# Patient Record
Sex: Female | Born: 1975 | Race: Black or African American | Hispanic: No | Marital: Single | Smoking: Never smoker
Health system: Southern US, Community
[De-identification: ages and names within clinical notes are randomized; demographics above are authoritative.]

---

## 2018-04-09 DIAGNOSIS — L814 Other melanin hyperpigmentation: Secondary | ICD-10-CM | POA: Diagnosis not present

## 2018-05-20 DIAGNOSIS — H5203 Hypermetropia, bilateral: Secondary | ICD-10-CM | POA: Diagnosis not present

## 2018-06-10 DIAGNOSIS — H04202 Unspecified epiphora, left lacrimal gland: Secondary | ICD-10-CM | POA: Diagnosis not present

## 2018-07-04 DIAGNOSIS — Z130 Encounter for screening for diseases of the blood and blood-forming organs and certain disorders involving the immune mechanism: Secondary | ICD-10-CM | POA: Diagnosis not present

## 2018-07-04 DIAGNOSIS — Z6828 Body mass index (BMI) 28.0-28.9, adult: Secondary | ICD-10-CM | POA: Diagnosis not present

## 2018-07-04 DIAGNOSIS — R32 Unspecified urinary incontinence: Secondary | ICD-10-CM | POA: Diagnosis not present

## 2018-07-04 DIAGNOSIS — Z01419 Encounter for gynecological examination (general) (routine) without abnormal findings: Secondary | ICD-10-CM | POA: Diagnosis not present

## 2018-07-04 DIAGNOSIS — Z1231 Encounter for screening mammogram for malignant neoplasm of breast: Secondary | ICD-10-CM | POA: Diagnosis not present

## 2018-07-04 DIAGNOSIS — Z1329 Encounter for screening for other suspected endocrine disorder: Secondary | ICD-10-CM | POA: Diagnosis not present

## 2018-07-04 DIAGNOSIS — Z13220 Encounter for screening for lipoid disorders: Secondary | ICD-10-CM | POA: Diagnosis not present

## 2018-07-04 DIAGNOSIS — Z1151 Encounter for screening for human papillomavirus (HPV): Secondary | ICD-10-CM | POA: Diagnosis not present

## 2018-07-04 DIAGNOSIS — Z131 Encounter for screening for diabetes mellitus: Secondary | ICD-10-CM | POA: Diagnosis not present

## 2018-07-04 DIAGNOSIS — Z0000 Encounter for general adult medical examination without abnormal findings: Secondary | ICD-10-CM | POA: Diagnosis not present

## 2018-07-04 DIAGNOSIS — Z124 Encounter for screening for malignant neoplasm of cervix: Secondary | ICD-10-CM | POA: Diagnosis not present

## 2018-07-23 DIAGNOSIS — N6489 Other specified disorders of breast: Secondary | ICD-10-CM | POA: Diagnosis not present

## 2019-04-12 DIAGNOSIS — Z20828 Contact with and (suspected) exposure to other viral communicable diseases: Secondary | ICD-10-CM | POA: Diagnosis not present

## 2019-06-23 DIAGNOSIS — N920 Excessive and frequent menstruation with regular cycle: Secondary | ICD-10-CM | POA: Diagnosis not present

## 2019-06-24 ENCOUNTER — Other Ambulatory Visit: Payer: Self-pay | Admitting: Obstetrics and Gynecology

## 2019-06-24 DIAGNOSIS — D259 Leiomyoma of uterus, unspecified: Secondary | ICD-10-CM

## 2019-07-01 ENCOUNTER — Encounter: Payer: Self-pay | Admitting: *Deleted

## 2019-07-01 ENCOUNTER — Ambulatory Visit
Admission: RE | Admit: 2019-07-01 | Discharge: 2019-07-01 | Disposition: A | Discharge status: Home / Self Care | Payer: BC Managed Care – PPO | Source: Ambulatory Visit | Attending: Obstetrics and Gynecology | Admitting: Obstetrics and Gynecology

## 2019-07-01 ENCOUNTER — Other Ambulatory Visit: Payer: Self-pay

## 2019-07-01 DIAGNOSIS — D259 Leiomyoma of uterus, unspecified: Secondary | ICD-10-CM | POA: Diagnosis not present

## 2019-07-01 HISTORY — PX: IR RADIOLOGIST EVAL & MGMT: IMG5224

## 2019-07-01 NOTE — Consult Note (Signed)
Chief Complaint:  Symptomatic uterine fibroids  Referring Physician(s): Cousins,Sheronette  History of Present Illness: Jasmine Garcia is a 44 y.o. female G5, P2 A3.  No future pregnancy plans.  She has 2 sons as well as 1 grandchild.  No menopausal type symptoms.  Review of her menstrual history performed.  She has a regular monthly cycle lasting up to 5 days with 2 heavy days.  No intraperiod Bleeding.  She describes passage of blood clots.  She also has bulk related symptoms with pelvic pressure, pain, discomfort.  Her uterus is enlarged and can be felt below the umbilicus per the patient.  She currently is not on any birth control pills or hormone replacement therapy.  She had a remote myomectomy 2012.  No recent GYN infections or abnormal discharge.  Unremarkable Pap smear.  Office ultrasound confirms multiple uterine fibroids with the uterus measuring up to 17 weeks size.  No past medical history on file.    Allergies: Patient has no allergy information on record.  Medications: Prior to Admission medications   Not on File     No family history on file.  Social History   Socioeconomic History  . Marital status: Not on file    Spouse name: Not on file  . Number of children: Not on file  . Years of education: Not on file  . Highest education level: Not on file  Occupational History  . Not on file  Tobacco Use  . Smoking status: Not on file  Substance and Sexual Activity  . Alcohol use: Not on file  . Drug use: Not on file  . Sexual activity: Not on file  Other Topics Concern  . Not on file  Social History Narrative  . Not on file   Social Determinants of Health   Financial Resource Strain:   . Difficulty of Paying Living Expenses: Not on file  Food Insecurity:   . Worried About Charity fundraiser in the Last Year: Not on file  . Ran Out of Food in the Last Year: Not on file  Transportation Needs:   . Lack of Transportation (Medical): Not on file  .  Lack of Transportation (Non-Medical): Not on file  Physical Activity:   . Days of Exercise per Week: Not on file  . Minutes of Exercise per Session: Not on file  Stress:   . Feeling of Stress : Not on file  Social Connections:   . Frequency of Communication with Friends and Family: Not on file  . Frequency of Social Gatherings with Friends and Family: Not on file  . Attends Religious Services: Not on file  . Active Member of Clubs or Organizations: Not on file  . Attends Archivist Meetings: Not on file  . Marital Status: Not on file     Review of Systems  Review of Systems: A 12 point ROS discussed and pertinent positives are indicated in the HPI above.  All other systems are negative.  Physical Exam No direct physical exam was performed, telephone health visit only today because of Covid pandemic Vital Signs: There were no vitals taken for this visit.  Imaging: No results found.  Labs:  CBC: No results for input(s): WBC, HGB, HCT, PLT in the last 8760 hours.  COAGS: No results for input(s): INR, APTT in the last 8760 hours.  BMP: No results for input(s): NA, K, CL, CO2, GLUCOSE, BUN, CALCIUM, CREATININE, GFRNONAA, GFRAA in the last 8760 hours.  Invalid input(s): CMP  LIVER FUNCTION TESTS: No results for input(s): BILITOT, AST, ALT, ALKPHOS, PROT, ALBUMIN in the last 8760 hours.   Assessment and Plan:  Symptomatic uterine fibroids with abnormal menstrual bleeding as well as bulk related symptoms with pelvic pain, pressure and discomfort.  Symptoms have recurred despite remote myomectomy.  Ultrasound confirms enlarged fibroid uterus measuring up to 17-week size.  The uterine fibroid embolization procedure was reviewed in detail including the procedure, risk, benefits and expected goals and outcomes.  All risk reviewed.  She understands this requires an overnight recovery for pain management, hydration and to control any nausea or vomiting.  All questions  were addressed.  She has a clear understanding of the procedure after discussion.  She would like to proceed with the work-up including pelvic MRI.  Plan: Scheduled for outpatient pelvic MRI without and with contrast to assess fibroid anatomy for embolization.  Thank you for this interesting consult.  I greatly enjoyed meeting Jasmine Garcia and look forward to participating in their care.  A copy of this report was sent to the requesting provider on this date.  Electronically Signed: Greggory Keen 07/01/2019, 3:27 PM   I spent a total of  40 Minutes   in remote  clinical consultation, greater than 50% of which was counseling/coordinating care for with symptomatic uterine fibroids.    Visit type: Audio only (telephone). Audio (no video) only due to patient's lack of internet/smartphone capability. Alternative for in-person consultation at [facility], [address] [place], [place], [place]. This visit type was conducted due to national recommendations for restrictions regarding the COVID-19 Pandemic (e.g. social distancing).  This format is felt to be most appropriate for this patient at this time.  All issues noted in this document were discussed and addressed.

## 2019-07-02 ENCOUNTER — Other Ambulatory Visit: Payer: Self-pay | Admitting: Obstetrics and Gynecology

## 2019-07-02 DIAGNOSIS — D259 Leiomyoma of uterus, unspecified: Secondary | ICD-10-CM

## 2019-07-24 ENCOUNTER — Ambulatory Visit
Admission: RE | Admit: 2019-07-24 | Discharge: 2019-07-24 | Disposition: A | Discharge status: Home / Self Care | Payer: BC Managed Care – PPO | Source: Ambulatory Visit | Attending: Obstetrics and Gynecology | Admitting: Obstetrics and Gynecology

## 2019-07-24 ENCOUNTER — Other Ambulatory Visit: Payer: Self-pay

## 2019-07-24 DIAGNOSIS — D252 Subserosal leiomyoma of uterus: Secondary | ICD-10-CM | POA: Diagnosis not present

## 2019-07-24 DIAGNOSIS — D259 Leiomyoma of uterus, unspecified: Secondary | ICD-10-CM

## 2019-07-24 MED ORDER — GADOBENATE DIMEGLUMINE 529 MG/ML IV SOLN
14.0000 mL | Freq: Once | INTRAVENOUS | Status: AC | PRN
  Administered 2019-07-24: 14 mL via INTRAVENOUS

## 2019-08-13 ENCOUNTER — Other Ambulatory Visit (HOSPITAL_COMMUNITY): Payer: Self-pay | Admitting: Interventional Radiology

## 2019-08-13 DIAGNOSIS — D259 Leiomyoma of uterus, unspecified: Secondary | ICD-10-CM

## 2019-08-28 ENCOUNTER — Other Ambulatory Visit: Payer: Self-pay | Admitting: Interventional Radiology

## 2019-08-28 DIAGNOSIS — D259 Leiomyoma of uterus, unspecified: Secondary | ICD-10-CM

## 2019-09-10 ENCOUNTER — Other Ambulatory Visit (HOSPITAL_COMMUNITY)
Admission: RE | Admit: 2019-09-10 | Discharge: 2019-09-10 | Disposition: A | Discharge status: Home / Self Care | Payer: BC Managed Care – PPO | Source: Ambulatory Visit | Attending: Interventional Radiology | Admitting: Interventional Radiology

## 2019-09-10 DIAGNOSIS — Z20822 Contact with and (suspected) exposure to covid-19: Secondary | ICD-10-CM | POA: Diagnosis not present

## 2019-09-10 DIAGNOSIS — Z01812 Encounter for preprocedural laboratory examination: Secondary | ICD-10-CM | POA: Insufficient documentation

## 2019-09-11 ENCOUNTER — Other Ambulatory Visit (HOSPITAL_COMMUNITY): Payer: BC Managed Care – PPO

## 2019-09-11 ENCOUNTER — Other Ambulatory Visit: Payer: Self-pay | Admitting: Radiology

## 2019-09-11 LAB — SARS CORONAVIRUS 2 (TAT 6-24 HRS): SARS Coronavirus 2: NEGATIVE

## 2019-09-12 ENCOUNTER — Encounter (HOSPITAL_COMMUNITY): Payer: Self-pay

## 2019-09-12 ENCOUNTER — Observation Stay (HOSPITAL_COMMUNITY)
Admission: RE | Admit: 2019-09-12 | Discharge: 2019-09-13 | Disposition: A | Discharge status: Home / Self Care | Payer: BC Managed Care – PPO | Source: Ambulatory Visit | Attending: Interventional Radiology | Admitting: Interventional Radiology

## 2019-09-12 ENCOUNTER — Ambulatory Visit (HOSPITAL_COMMUNITY)
Admission: RE | Admit: 2019-09-12 | Discharge: 2019-09-12 | Disposition: A | Discharge status: Home / Self Care | Payer: BC Managed Care – PPO | Source: Ambulatory Visit | Attending: Interventional Radiology | Admitting: Interventional Radiology

## 2019-09-12 ENCOUNTER — Other Ambulatory Visit: Payer: Self-pay

## 2019-09-12 DIAGNOSIS — Z79899 Other long term (current) drug therapy: Secondary | ICD-10-CM | POA: Insufficient documentation

## 2019-09-12 DIAGNOSIS — D259 Leiomyoma of uterus, unspecified: Principal | ICD-10-CM

## 2019-09-12 DIAGNOSIS — D219 Benign neoplasm of connective and other soft tissue, unspecified: Secondary | ICD-10-CM | POA: Diagnosis present

## 2019-09-12 HISTORY — PX: IR EMBO TUMOR ORGAN ISCHEMIA INFARCT INC GUIDE ROADMAPPING: IMG5449

## 2019-09-12 HISTORY — PX: IR ANGIOGRAM SELECTIVE EACH ADDITIONAL VESSEL: IMG667

## 2019-09-12 HISTORY — PX: IR US GUIDE VASC ACCESS RIGHT: IMG2390

## 2019-09-12 HISTORY — PX: IR ANGIOGRAM PELVIS SELECTIVE OR SUPRASELECTIVE: IMG661

## 2019-09-12 LAB — BASIC METABOLIC PANEL
Anion gap: 7 (ref 5–15)
BUN: 10 mg/dL (ref 6–20)
CO2: 24 mmol/L (ref 22–32)
Calcium: 9 mg/dL (ref 8.9–10.3)
Chloride: 109 mmol/L (ref 98–111)
Creatinine, Ser: 0.71 mg/dL (ref 0.44–1.00)
GFR calc Af Amer: >60 mL/min (ref >60)
GFR calc non Af Amer: >60 mL/min (ref >60)
Glucose, Bld: 106 mg/dL — ABNORMAL HIGH (ref 70–99)
Potassium: 3.9 mmol/L (ref 3.5–5.1)
Sodium: 140 mmol/L (ref 135–145)

## 2019-09-12 LAB — CBC WITH DIFFERENTIAL/PLATELET
Abs Immature Granulocytes: 0.01 10*3/uL (ref 0.00–0.07)
Basophils Absolute: 0.1 10*3/uL (ref 0.0–0.1)
Basophils Relative: 1 %
Eosinophils Absolute: 0.1 10*3/uL (ref 0.0–0.5)
Eosinophils Relative: 2 %
HCT: 32.3 % — ABNORMAL LOW (ref 36.0–46.0)
Hemoglobin: 9.4 g/dL — ABNORMAL LOW (ref 12.0–15.0)
Immature Granulocytes: 0 %
Lymphocytes Relative: 41 %
Lymphs Abs: 1.9 10*3/uL (ref 0.7–4.0)
MCH: 22.9 pg — ABNORMAL LOW (ref 26.0–34.0)
MCHC: 29.1 g/dL — ABNORMAL LOW (ref 30.0–36.0)
MCV: 78.8 fL — ABNORMAL LOW (ref 80.0–100.0)
Monocytes Absolute: 0.4 10*3/uL (ref 0.1–1.0)
Monocytes Relative: 9 %
Neutro Abs: 2.2 10*3/uL (ref 1.7–7.7)
Neutrophils Relative %: 47 %
Platelets: 346 10*3/uL (ref 150–400)
RBC: 4.1 MIL/uL (ref 3.87–5.11)
RDW: 23.4 % — ABNORMAL HIGH (ref 11.5–15.5)
WBC: 4.6 10*3/uL (ref 4.0–10.5)
nRBC: 0 % (ref 0.0–0.2)

## 2019-09-12 LAB — HCG, SERUM, QUALITATIVE: Preg, Serum: NEGATIVE

## 2019-09-12 LAB — PROTIME-INR
INR: 0.9 (ref 0.8–1.2)
Prothrombin Time: 12.2 seconds (ref 11.4–15.2)

## 2019-09-12 MED ORDER — PROMETHAZINE HCL 25 MG RE SUPP: 25.0000 mg | Freq: Three times a day (TID) | RECTAL | Status: DC | PRN

## 2019-09-12 MED ORDER — MIDAZOLAM HCL 2 MG/2ML IJ SOLN
INTRAMUSCULAR | Status: AC
  Filled 2019-09-12: qty 6

## 2019-09-12 MED ORDER — SODIUM CHLORIDE 0.9% FLUSH: 3.0000 mL | INTRAVENOUS | Status: DC | PRN

## 2019-09-12 MED ORDER — ONDANSETRON HCL 4 MG/2ML IJ SOLN: 4.0000 mg | Freq: Four times a day (QID) | INTRAMUSCULAR | Status: DC | PRN

## 2019-09-12 MED ORDER — PROMETHAZINE HCL 25 MG PO TABS: 25.0000 mg | ORAL_TABLET | Freq: Three times a day (TID) | ORAL | Status: DC | PRN

## 2019-09-12 MED ORDER — LIDOCAINE HCL 1 % IJ SOLN
INTRAMUSCULAR | Status: AC
  Filled 2019-09-12: qty 20

## 2019-09-12 MED ORDER — IOHEXOL 300 MG/ML  SOLN: 100.0000 mL | Freq: Once | INTRAMUSCULAR | Status: DC | PRN

## 2019-09-12 MED ORDER — KETOROLAC TROMETHAMINE 30 MG/ML IJ SOLN
INTRAMUSCULAR | Status: AC
  Administered 2019-09-12: 30 mg via INTRAVENOUS
  Filled 2019-09-12: qty 1

## 2019-09-12 MED ORDER — FENTANYL CITRATE (PF) 100 MCG/2ML IJ SOLN
INTRAMUSCULAR | Status: AC | PRN
  Administered 2019-09-12 (×2): 50 ug via INTRAVENOUS

## 2019-09-12 MED ORDER — DOCUSATE SODIUM 100 MG PO CAPS
100.0000 mg | ORAL_CAPSULE | Freq: Two times a day (BID) | ORAL | Status: DC
  Administered 2019-09-12 – 2019-09-13 (×2): 100 mg via ORAL
  Filled 2019-09-12 (×3): qty 1

## 2019-09-12 MED ORDER — CEFAZOLIN SODIUM-DEXTROSE 2-4 GM/100ML-% IV SOLN: 2.0000 g | INTRAVENOUS | Status: AC

## 2019-09-12 MED ORDER — DIPHENHYDRAMINE HCL 50 MG/ML IJ SOLN: 12.5000 mg | Freq: Four times a day (QID) | INTRAMUSCULAR | Status: DC | PRN

## 2019-09-12 MED ORDER — HYDROMORPHONE 1 MG/ML IV SOLN
INTRAVENOUS | Status: DC
  Administered 2019-09-12: 30 mg via INTRAVENOUS
  Filled 2019-09-12: qty 30

## 2019-09-12 MED ORDER — DOCUSATE SODIUM 100 MG PO CAPS: 100.0000 mg | ORAL_CAPSULE | Freq: Two times a day (BID) | ORAL | Status: DC

## 2019-09-12 MED ORDER — FERROUS SULFATE 325 (65 FE) MG PO TABS
325.0000 mg | ORAL_TABLET | Freq: Three times a day (TID) | ORAL | Status: DC
  Administered 2019-09-13: 325 mg via ORAL
  Filled 2019-09-12: qty 1

## 2019-09-12 MED ORDER — SODIUM CHLORIDE 0.9% FLUSH: 3.0000 mL | Freq: Two times a day (BID) | INTRAVENOUS | Status: DC

## 2019-09-12 MED ORDER — SODIUM CHLORIDE 0.9 % IV SOLN: INTRAVENOUS | Status: DC

## 2019-09-12 MED ORDER — MIDAZOLAM HCL 2 MG/2ML IJ SOLN
INTRAMUSCULAR | Status: AC | PRN
  Administered 2019-09-12 (×4): 1 mg via INTRAVENOUS

## 2019-09-12 MED ORDER — DIPHENHYDRAMINE HCL 12.5 MG/5ML PO ELIX
12.5000 mg | ORAL_SOLUTION | Freq: Four times a day (QID) | ORAL | Status: DC | PRN
  Filled 2019-09-12: qty 5

## 2019-09-12 MED ORDER — SODIUM CHLORIDE 0.9 % IV SOLN: 250.0000 mL | INTRAVENOUS | Status: DC | PRN

## 2019-09-12 MED ORDER — NALOXONE HCL 0.4 MG/ML IJ SOLN: 0.4000 mg | INTRAMUSCULAR | Status: DC | PRN

## 2019-09-12 MED ORDER — KETOROLAC TROMETHAMINE 30 MG/ML IJ SOLN: 30.0000 mg | INTRAMUSCULAR | Status: AC

## 2019-09-12 MED ORDER — LIDOCAINE HCL (PF) 1 % IJ SOLN
INTRAMUSCULAR | Status: AC | PRN
  Administered 2019-09-12 (×2): 5 mL

## 2019-09-12 MED ORDER — FENTANYL CITRATE (PF) 100 MCG/2ML IJ SOLN
INTRAMUSCULAR | Status: AC
  Filled 2019-09-12: qty 4

## 2019-09-12 MED ORDER — SODIUM CHLORIDE 0.9% FLUSH: 9.0000 mL | INTRAVENOUS | Status: DC | PRN

## 2019-09-12 MED ORDER — CEFAZOLIN SODIUM-DEXTROSE 2-4 GM/100ML-% IV SOLN
INTRAVENOUS | Status: AC
  Administered 2019-09-12: 2 g via INTRAVENOUS
  Filled 2019-09-12: qty 100

## 2019-09-12 MED ORDER — DEXAMETHASONE 4 MG PO TABS
8.0000 mg | ORAL_TABLET | Freq: Once | ORAL | Status: AC
  Administered 2019-09-12: 8 mg via ORAL
  Filled 2019-09-12: qty 2

## 2019-09-12 MED ORDER — ONDANSETRON HCL 4 MG/2ML IJ SOLN
4.0000 mg | Freq: Four times a day (QID) | INTRAMUSCULAR | Status: DC | PRN
  Administered 2019-09-12: 4 mg via INTRAVENOUS
  Filled 2019-09-12 (×2): qty 2

## 2019-09-12 MED ORDER — KETOROLAC TROMETHAMINE 30 MG/ML IJ SOLN
30.0000 mg | Freq: Four times a day (QID) | INTRAMUSCULAR | Status: DC
  Administered 2019-09-12 – 2019-09-13 (×4): 30 mg via INTRAVENOUS
  Filled 2019-09-12 (×4): qty 1

## 2019-09-12 NOTE — Progress Notes (Signed)
Patient ID: Jasmine Garcia, female   DOB: [birth year], 44 y.o.   MRN: [id number] Pt c/o some expected pelvic cramping, occ nausea(possibly from dilaudid) VSS; afebrile Puncture sites R/L CFA soft, clean, dry, NT, no hematomas; intact distal pulses; yellow urine in foley cath   A/P: Pt with hx symptomatic uterine fibroids; s/p bilat uterine artery embo earlier today; for overnight obs for pain control; dilaudid PCA for pain, zofran prn nausea; advance diet as tolerated; d/c foley cath later today; for f/u with Dr. Annamaria Boots at [place] [facility] (either virtual or in person) in 3-4 weeks; f/u pelvic MRI in 6 months

## 2019-09-12 NOTE — Procedures (Deleted)
Interventional Radiology Procedure Note  Procedure: RT IJ POWER PORT  Complications: None  Estimated Blood Loss: MIN  Findings: TIP SVCRA       

## 2019-09-12 NOTE — Progress Notes (Signed)
Patient ID: Jasmine Garcia, female   DOB: 10-25-1975, 44 y.o.   MRN: [id number] Interventional Radiology Procedure Note  Procedure: UFE  Complications: None  Estimated Blood Loss: min  Findings: Successful bilateral embo

## 2019-09-12 NOTE — H&P (Signed)
Referring Physician(s): Cousins,S  Supervising Physician: Daryll Brod  Patient Status:  [facility] OP TBA  Chief Complaint: Symptomatic uterine fibroids   Subjective: Patient familiar to IR service from tele consultation with Dr. Annamaria Boots on 07/01/2019 to discuss treatment options for symptomatic uterine fibroids. She was deemed an appropriate candidate for bilateral uterine artery embolization and presents today for the procedure. She currently denies fever, headache, chest pain, dyspnea, cough, abdominal/back pain, nausea, vomiting, hematuria/dysuria.  She does have a history of menorrhagia and prior myomectomy in 2012.  History reviewed. No pertinent past medical history. Past Surgical History:  Procedure Laterality Date  . IR RADIOLOGIST EVAL & MGMT  07/01/2019     Allergies: Patient has no allergy information on record.  Medications: Prior to Admission medications   Not on File     Vital Signs: Vitals:   09/12/19 0834  BP: (!) 142/97  Pulse: 86  Resp: 18  Temp: 99 F (37.2 C)  SpO2: 100%      Physical Exam:  Awake/alert; chest- CTA bilat; heart- RRR; abd- soft,+BS,NT; ext- no edema  Imaging: No results found.  Labs:  CBC: No results for input(s): WBC, HGB, HCT, PLT in the last 8760 hours.  COAGS: No results for input(s): INR, APTT in the last 8760 hours.  BMP: No results for input(s): NA, K, CL, CO2, GLUCOSE, BUN, CALCIUM, CREATININE, GFRNONAA, GFRAA in the last 8760 hours.  Invalid input(s): CMP  LIVER FUNCTION TESTS: No results for input(s): BILITOT, AST, ALT, ALKPHOS, PROT, ALBUMIN in the last 8760 hours.  Assessment and Plan: Pt with hx symptomatic uterine fibroids, s/p tele consult with Dr. Annamaria Boots on 07/01/19 to discuss treatment options and deemed an appropriate candidate for bilateral uterine artery embolization. She presents today for the procedure. Risks and benefits of procedure were discussed with the patient including, but not limited to  bleeding, infection, vascular injury or contrast induced renal failure.  This interventional procedure involves the use of X-rays and because of the nature of the planned procedure, it is possible that we will have prolonged use of X-ray fluoroscopy.  Potential radiation risks to you include (but are not limited to) the following: - A slightly elevated risk for cancer  several years later in life. This risk is typically less than 0.5% percent. This risk is low in comparison to the normal incidence of human cancer, which is 33% for women and 50% for men according to the [place]. - Radiation induced injury can include skin redness, resembling a rash, tissue breakdown / ulcers and hair loss (which can be temporary or permanent).   The likelihood of either of these occurring depends on the difficulty of the procedure and whether you are sensitive to radiation due to previous procedures, disease, or genetic conditions.   IF your procedure requires a prolonged use of radiation, you will be notified and given written instructions for further action.  It is your responsibility to monitor the irradiated area for the 2 weeks following the procedure and to notify your physician if you are concerned that you have suffered a radiation induced injury.    All of the patient's questions were answered, patient is agreeable to proceed.  Consent signed and in chart.  Postprocedure she will be admitted for overnight obs for pain control    Electronically Signed: D. Rowe Robert, PA-C 09/12/2019, 8:31 AM   I spent a total of 30 minutes at the the patient's bedside AND on the patient's hospital floor or unit,  greater than 50% of which was counseling/coordinating care for bilateral uterine artery embolization

## 2019-09-13 DIAGNOSIS — D259 Leiomyoma of uterus, unspecified: Secondary | ICD-10-CM | POA: Diagnosis not present

## 2019-09-13 DIAGNOSIS — Z79899 Other long term (current) drug therapy: Secondary | ICD-10-CM | POA: Diagnosis not present

## 2019-09-13 MED ORDER — IBUPROFEN 600 MG PO TABS: 600.0000 mg | ORAL_TABLET | Freq: Four times a day (QID) | ORAL | 0 refills | Status: AC

## 2019-09-13 MED ORDER — TRAMADOL HCL 50 MG PO TABS: 50.0000 mg | ORAL_TABLET | Freq: Four times a day (QID) | ORAL | 0 refills | Status: AC | PRN

## 2019-09-13 MED ORDER — DOCUSATE SODIUM 100 MG PO CAPS: 100.0000 mg | ORAL_CAPSULE | Freq: Every day | ORAL | 0 refills | Status: AC | PRN

## 2019-09-13 MED ORDER — TRAMADOL HCL 50 MG PO TABS
50.0000 mg | ORAL_TABLET | Freq: Four times a day (QID) | ORAL | Status: DC | PRN
  Administered 2019-09-13: 50 mg via ORAL
  Filled 2019-09-13: qty 1

## 2019-09-13 MED ORDER — ONDANSETRON HCL 8 MG PO TABS: 8.0000 mg | ORAL_TABLET | Freq: Three times a day (TID) | ORAL | 0 refills | Status: AC | PRN

## 2019-09-13 NOTE — Progress Notes (Signed)
Patient discharged to home with family, discharge instructions reviewed with patient who verbalized understanding. New RX's sent electronically to pharmacy. 

## 2019-09-13 NOTE — Discharge Summary (Signed)
Patient ID: Jasmine Garcia MRN: [id number] DOB/AGE: 1975/10/15 44 y.o.  Admit date: 09/12/2019 Discharge date: 09/13/2019  Supervising Physician: Markus Daft  Patient Status: [name] - In-pt  Admission Diagnoses: Symptomatic uterine leiomyoma  Discharge Diagnoses:  Active Problems:   Uterine leiomyoma   Fibroids   Discharged Condition: good  Hospital Course: Ms. Zaccaria presented to IR on 09/12/19 for planned uterine artery embolization which occurred as planned without immediate complications. She was admitted for overnight observation and pain control. She reports some nausea and abdominal pain last night but feels great today and is ready to go home ASAP. She has been able to eat, drink, urinate and ambulate without issue today. Per RN and patient did not use PCA as patient had previously used dilaudid and did not like the way it felt, she did use PRN Ultram x 1. She is requesting Ultram at d/c for PRN pain control instead of narcotic medications. Discussed home wound care instructions and return precautions with patient today. She will see [place] in clinic in 3-4 weeks (IR scheduler will call with appointment date/time) and a pelvic MRI in 6 months. She was encouraged to call our office with any questions or concerns before her appointment. Patient understands and is in agreement with above plan.  Consults: None  Significant Diagnostic Studies: IR Angiogram Pelvis Selective Or Supraselective  Result Date: 09/12/2019 INDICATION: Symptomatic uterine fibroids, abnormal menstrual bleeding, her deficiency anemia EXAM: UTERINE FIBROID EMBOLIZATION Date:  09/12/2019 09/12/2019 11:28 am Radiologist:  M. Daryll Brod, MD Guidance:  Ultrasound and fluoroscopic MEDICATIONS: Ancef 2 g. The antibiotic was administered within 1 hour of the procedure ANESTHESIA/SEDATION: Fentanyl 100 mcg IV; Versed 4.0 mg IV, 30 mg Toradol Moderate Sedation Time:  54 minutes The patient was continuously monitored during the  procedure by the interventional radiology nurse under my direct supervision. CONTRAST:  120 cc omni 300 FLUOROSCOPY TIME:  Fluoroscopy Time: 10 minutes 30 seconds (1,016 mGy). COMPLICATIONS: None immediate. PROCEDURE: Informed consent was obtained from the patient following explanation of the procedure, risks, benefits and alternatives. The patient understands, agrees and consents for the procedure. All questions were addressed. A time out was performed. Maximal barrier sterile technique utilized including caps, mask, sterile gowns, sterile gloves, large sterile drape, hand hygiene, and betadine prep. Under sterile conditions and local anesthesia, bilateralcommon femoral artery access was performed with a micropuncture needles. Ultrasound was utilized for access. Images were obtained for documentation of the patent bilateral common femoral arteries. Bentson guidewires were advanced followed by 5-French sheaths A 5-French C2 catheter was utilized to select the contralateral left internal iliac artery. Selective left internal iliac angiogram was performed. The tortuous left uterine artery was identified. Selective catheterization was performed of the left uterine artery with a microcatheter and micro guide wire. A selective left uterine angiogram was performed. This demonstrated patency of the left uterine artery. Mild diffuse hypervascularity of the enlarged fibroid uterus. Access was adequate for embolization. For embolization, 1.5 vials of 500 - 700 micron Embospheres and 1.5 vialsof 700-900 micron Embospheres were injected into the left uterine artery. Post embolization angiogram confirms complete stasis of the left uterine vascular territory. Microcatheter was removed. A C2 catheter was also utilized to select the right internal iliac artery. Selective right internal iliac angiogram was performed. The patent right uterine artery was identified. For selective catheterization, the micro catheter and guidewire were  utilized to select the right uterine artery. Selective right uterine angiogram was performed. This demonstrated patency of the right uterine artery.  Catheter position was safe for embolization. Embolization was performed to complete stasis with injection of 1.5 vials of 500-700 micron Embospheres and 1.5 vialsof 700-900 micron Embospheres. Post embolization angiogram confirms complete stasis of the right uterine vascular territory. C2 catheters and micro catheters were removed. Injection of both common femoral artery sheath confirms access is adequate for ExoSeal. Hemostasis was obtained with and ExoSeal device on the left but unsuccessful on the right. No immediate complication. Peripheral pedal pulses are normal +2. The patient tolerated the procedure well. No immediate complication. IMPRESSION: Successful bilateral uterine artery embolization (U F E) Electronically Signed   By: Jerilynn Mages.  Shick M.D.   On: 09/12/2019 11:57   IR Angiogram Selective Each Additional Vessel  Result Date: 09/12/2019 INDICATION: Symptomatic uterine fibroids, abnormal menstrual bleeding, her deficiency anemia EXAM: UTERINE FIBROID EMBOLIZATION Date:  09/12/2019 09/12/2019 11:28 am Radiologist:  M. Daryll Brod, MD Guidance:  Ultrasound and fluoroscopic MEDICATIONS: Ancef 2 g. The antibiotic was administered within 1 hour of the procedure ANESTHESIA/SEDATION: Fentanyl 100 mcg IV; Versed 4.0 mg IV, 30 mg Toradol Moderate Sedation Time:  54 minutes The patient was continuously monitored during the procedure by the interventional radiology nurse under my direct supervision. CONTRAST:  120 cc omni 300 FLUOROSCOPY TIME:  Fluoroscopy Time: 10 minutes 30 seconds (1,016 mGy). COMPLICATIONS: None immediate. PROCEDURE: Informed consent was obtained from the patient following explanation of the procedure, risks, benefits and alternatives. The patient understands, agrees and consents for the procedure. All questions were addressed. A time out was  performed. Maximal barrier sterile technique utilized including caps, mask, sterile gowns, sterile gloves, large sterile drape, hand hygiene, and betadine prep. Under sterile conditions and local anesthesia, bilateralcommon femoral artery access was performed with a micropuncture needles. Ultrasound was utilized for access. Images were obtained for documentation of the patent bilateral common femoral arteries. Bentson guidewires were advanced followed by 5-French sheaths A 5-French C2 catheter was utilized to select the contralateral left internal iliac artery. Selective left internal iliac angiogram was performed. The tortuous left uterine artery was identified. Selective catheterization was performed of the left uterine artery with a microcatheter and micro guide wire. A selective left uterine angiogram was performed. This demonstrated patency of the left uterine artery. Mild diffuse hypervascularity of the enlarged fibroid uterus. Access was adequate for embolization. For embolization, 1.5 vials of 500 - 700 micron Embospheres and 1.5 vialsof 700-900 micron Embospheres were injected into the left uterine artery. Post embolization angiogram confirms complete stasis of the left uterine vascular territory. Microcatheter was removed. A C2 catheter was also utilized to select the right internal iliac artery. Selective right internal iliac angiogram was performed. The patent right uterine artery was identified. For selective catheterization, the micro catheter and guidewire were utilized to select the right uterine artery. Selective right uterine angiogram was performed. This demonstrated patency of the right uterine artery. Catheter position was safe for embolization. Embolization was performed to complete stasis with injection of 1.5 vials of 500-700 micron Embospheres and 1.5 vialsof 700-900 micron Embospheres. Post embolization angiogram confirms complete stasis of the right uterine vascular territory. C2 catheters  and micro catheters were removed. Injection of both common femoral artery sheath confirms access is adequate for ExoSeal. Hemostasis was obtained with and ExoSeal device on the left but unsuccessful on the right. No immediate complication. Peripheral pedal pulses are normal +2. The patient tolerated the procedure well. No immediate complication. IMPRESSION: Successful bilateral uterine artery embolization (U F E) Electronically Signed   By: Jerilynn Mages.  Shick M.D.   On: 09/12/2019 11:57   IR Angiogram Selective Each Additional Vessel  Result Date: 09/12/2019 INDICATION: Symptomatic uterine fibroids, abnormal menstrual bleeding, her deficiency anemia EXAM: UTERINE FIBROID EMBOLIZATION Date:  09/12/2019 09/12/2019 11:28 am Radiologist:  M. Daryll Brod, MD Guidance:  Ultrasound and fluoroscopic MEDICATIONS: Ancef 2 g. The antibiotic was administered within 1 hour of the procedure ANESTHESIA/SEDATION: Fentanyl 100 mcg IV; Versed 4.0 mg IV, 30 mg Toradol Moderate Sedation Time:  54 minutes The patient was continuously monitored during the procedure by the interventional radiology nurse under my direct supervision. CONTRAST:  120 cc omni 300 FLUOROSCOPY TIME:  Fluoroscopy Time: 10 minutes 30 seconds (1,016 mGy). COMPLICATIONS: None immediate. PROCEDURE: Informed consent was obtained from the patient following explanation of the procedure, risks, benefits and alternatives. The patient understands, agrees and consents for the procedure. All questions were addressed. A time out was performed. Maximal barrier sterile technique utilized including caps, mask, sterile gowns, sterile gloves, large sterile drape, hand hygiene, and betadine prep. Under sterile conditions and local anesthesia, bilateralcommon femoral artery access was performed with a micropuncture needles. Ultrasound was utilized for access. Images were obtained for documentation of the patent bilateral common femoral arteries. Bentson guidewires were advanced followed  by 5-French sheaths A 5-French C2 catheter was utilized to select the contralateral left internal iliac artery. Selective left internal iliac angiogram was performed. The tortuous left uterine artery was identified. Selective catheterization was performed of the left uterine artery with a microcatheter and micro guide wire. A selective left uterine angiogram was performed. This demonstrated patency of the left uterine artery. Mild diffuse hypervascularity of the enlarged fibroid uterus. Access was adequate for embolization. For embolization, 1.5 vials of 500 - 700 micron Embospheres and 1.5 vialsof 700-900 micron Embospheres were injected into the left uterine artery. Post embolization angiogram confirms complete stasis of the left uterine vascular territory. Microcatheter was removed. A C2 catheter was also utilized to select the right internal iliac artery. Selective right internal iliac angiogram was performed. The patent right uterine artery was identified. For selective catheterization, the micro catheter and guidewire were utilized to select the right uterine artery. Selective right uterine angiogram was performed. This demonstrated patency of the right uterine artery. Catheter position was safe for embolization. Embolization was performed to complete stasis with injection of 1.5 vials of 500-700 micron Embospheres and 1.5 vialsof 700-900 micron Embospheres. Post embolization angiogram confirms complete stasis of the right uterine vascular territory. C2 catheters and micro catheters were removed. Injection of both common femoral artery sheath confirms access is adequate for ExoSeal. Hemostasis was obtained with and ExoSeal device on the left but unsuccessful on the right. No immediate complication. Peripheral pedal pulses are normal +2. The patient tolerated the procedure well. No immediate complication. IMPRESSION: Successful bilateral uterine artery embolization (U F E) Electronically Signed   By: Jerilynn Mages.  Shick  M.D.   On: 09/12/2019 11:57   IR US Guide Vasc Access Right  Result Date: 09/12/2019 INDICATION: Symptomatic uterine fibroids, abnormal menstrual bleeding, her deficiency anemia EXAM: UTERINE FIBROID EMBOLIZATION Date:  09/12/2019 09/12/2019 11:28 am Radiologist:  M. Daryll Brod, MD Guidance:  Ultrasound and fluoroscopic MEDICATIONS: Ancef 2 g. The antibiotic was administered within 1 hour of the procedure ANESTHESIA/SEDATION: Fentanyl 100 mcg IV; Versed 4.0 mg IV, 30 mg Toradol Moderate Sedation Time:  54 minutes The patient was continuously monitored during the procedure by the interventional radiology nurse under my direct supervision. CONTRAST:  120 cc omni 300 FLUOROSCOPY TIME:  Fluoroscopy Time: 10 minutes 30 seconds (1,016 mGy). COMPLICATIONS: None immediate. PROCEDURE: Informed consent was obtained from the patient following explanation of the procedure, risks, benefits and alternatives. The patient understands, agrees and consents for the procedure. All questions were addressed. A time out was performed. Maximal barrier sterile technique utilized including caps, mask, sterile gowns, sterile gloves, large sterile drape, hand hygiene, and betadine prep. Under sterile conditions and local anesthesia, bilateralcommon femoral artery access was performed with a micropuncture needles. Ultrasound was utilized for access. Images were obtained for documentation of the patent bilateral common femoral arteries. Bentson guidewires were advanced followed by 5-French sheaths A 5-French C2 catheter was utilized to select the contralateral left internal iliac artery. Selective left internal iliac angiogram was performed. The tortuous left uterine artery was identified. Selective catheterization was performed of the left uterine artery with a microcatheter and micro guide wire. A selective left uterine angiogram was performed. This demonstrated patency of the left uterine artery. Mild diffuse hypervascularity of the  enlarged fibroid uterus. Access was adequate for embolization. For embolization, 1.5 vials of 500 - 700 micron Embospheres and 1.5 vialsof 700-900 micron Embospheres were injected into the left uterine artery. Post embolization angiogram confirms complete stasis of the left uterine vascular territory. Microcatheter was removed. A C2 catheter was also utilized to select the right internal iliac artery. Selective right internal iliac angiogram was performed. The patent right uterine artery was identified. For selective catheterization, the micro catheter and guidewire were utilized to select the right uterine artery. Selective right uterine angiogram was performed. This demonstrated patency of the right uterine artery. Catheter position was safe for embolization. Embolization was performed to complete stasis with injection of 1.5 vials of 500-700 micron Embospheres and 1.5 vialsof 700-900 micron Embospheres. Post embolization angiogram confirms complete stasis of the right uterine vascular territory. C2 catheters and micro catheters were removed. Injection of both common femoral artery sheath confirms access is adequate for ExoSeal. Hemostasis was obtained with and ExoSeal device on the left but unsuccessful on the right. No immediate complication. Peripheral pedal pulses are normal +2. The patient tolerated the procedure well. No immediate complication. IMPRESSION: Successful bilateral uterine artery embolization (U F E) Electronically Signed   By: Jerilynn Mages.  Shick M.D.   On: 09/12/2019 11:57   IR EMBO TUMOR ORGAN ISCHEMIA INFARCT INC GUIDE ROADMAPPING  Result Date: 09/12/2019 INDICATION: Symptomatic uterine fibroids, abnormal menstrual bleeding, her deficiency anemia EXAM: UTERINE FIBROID EMBOLIZATION Date:  09/12/2019 09/12/2019 11:28 am Radiologist:  M. Daryll Brod, MD Guidance:  Ultrasound and fluoroscopic MEDICATIONS: Ancef 2 g. The antibiotic was administered within 1 hour of the procedure ANESTHESIA/SEDATION:  Fentanyl 100 mcg IV; Versed 4.0 mg IV, 30 mg Toradol Moderate Sedation Time:  54 minutes The patient was continuously monitored during the procedure by the interventional radiology nurse under my direct supervision. CONTRAST:  120 cc omni 300 FLUOROSCOPY TIME:  Fluoroscopy Time: 10 minutes 30 seconds (1,016 mGy). COMPLICATIONS: None immediate. PROCEDURE: Informed consent was obtained from the patient following explanation of the procedure, risks, benefits and alternatives. The patient understands, agrees and consents for the procedure. All questions were addressed. A time out was performed. Maximal barrier sterile technique utilized including caps, mask, sterile gowns, sterile gloves, large sterile drape, hand hygiene, and betadine prep. Under sterile conditions and local anesthesia, bilateralcommon femoral artery access was performed with a micropuncture needles. Ultrasound was utilized for access. Images were obtained for documentation of the patent bilateral common femoral arteries. Bentson guidewires were advanced followed by  5-French sheaths A 5-French C2 catheter was utilized to select the contralateral left internal iliac artery. Selective left internal iliac angiogram was performed. The tortuous left uterine artery was identified. Selective catheterization was performed of the left uterine artery with a microcatheter and micro guide wire. A selective left uterine angiogram was performed. This demonstrated patency of the left uterine artery. Mild diffuse hypervascularity of the enlarged fibroid uterus. Access was adequate for embolization. For embolization, 1.5 vials of 500 - 700 micron Embospheres and 1.5 vialsof 700-900 micron Embospheres were injected into the left uterine artery. Post embolization angiogram confirms complete stasis of the left uterine vascular territory. Microcatheter was removed. A C2 catheter was also utilized to select the right internal iliac artery. Selective right internal iliac  angiogram was performed. The patent right uterine artery was identified. For selective catheterization, the micro catheter and guidewire were utilized to select the right uterine artery. Selective right uterine angiogram was performed. This demonstrated patency of the right uterine artery. Catheter position was safe for embolization. Embolization was performed to complete stasis with injection of 1.5 vials of 500-700 micron Embospheres and 1.5 vialsof 700-900 micron Embospheres. Post embolization angiogram confirms complete stasis of the right uterine vascular territory. C2 catheters and micro catheters were removed. Injection of both common femoral artery sheath confirms access is adequate for ExoSeal. Hemostasis was obtained with and ExoSeal device on the left but unsuccessful on the right. No immediate complication. Peripheral pedal pulses are normal +2. The patient tolerated the procedure well. No immediate complication. IMPRESSION: Successful bilateral uterine artery embolization (U F E) Electronically Signed   By: Jerilynn Mages.  Shick M.D.   On: 09/12/2019 11:57    Treatments: analgesia: Ultram  Discharge Exam: Blood pressure 132/88, pulse 79, temperature 98 F (36.7 C), temperature source Oral, resp. rate 17, SpO2 97 %. Physical Exam Vitals and nursing note reviewed.  Constitutional:      General: She is not in acute distress.    Comments: Sitting in recliner, very pleasant, talkative.   HENT:     Head: Normocephalic.  Cardiovascular:     Rate and Rhythm: Normal rate.     Comments: (+) bilateral femoral puncture sites clean, dry, dressed appropriately without bleeding or discharge. Soft, no obvious hematoma. Pulmonary:     Effort: Pulmonary effort is normal.  Abdominal:     Palpations: Abdomen is soft.     Tenderness: There is abdominal tenderness (appropriately tender lower abdomen).  Skin:    General: Skin is warm and dry.  Neurological:     Mental Status: She is alert and oriented to person,  place, and time.  Psychiatric:        Mood and Affect: Mood normal.        Behavior: Behavior normal.        Thought Content: Thought content normal.        Judgment: Judgment normal.     Disposition:    Allergies as of 09/13/2019      Reactions   Egg [eggs Or Egg-derived Products] Rash      Medication List    STOP taking these medications   UNABLE TO FIND     TAKE these medications   docusate sodium 100 MG capsule Commonly known as: Colace Take 1 capsule (100 mg total) by mouth daily as needed for mild constipation or moderate constipation.   ferrous sulfate 325 (65 FE) MG EC tablet Take 325 mg by mouth 3 (three) times daily with meals.   ibuprofen 600  MG tablet Commonly known as: ADVIL Take 1 tablet (600 mg total) by mouth every 6 (six) hours for 5 days.   ondansetron 8 MG tablet Commonly known as: Zofran Take 1 tablet (8 mg total) by mouth every 8 (eight) hours as needed for nausea or vomiting.   traMADol 50 MG tablet Commonly known as: Ultram Take 1 tablet (50 mg total) by mouth every 6 (six) hours as needed for moderate pain or severe pain.      Follow-up Information    Greggory Keen, MD Follow up.   Specialties: Interventional Radiology, Radiology Why: IR scheduler will call you with appointment date/time. Please call with questions or concerns prior to your appointment. Contact information: [address] [place] [address] [phone]            Electronically Signed: Joaquim Nam, PA-C 09/13/2019, 10:30 AM   I have spent Less Than 30 Minutes discharging Tyiesha Picha.

## 2019-09-13 NOTE — Discharge Instructions (Addendum)
Please call Interventional Radiology clinic [phone] with any questions or concerns.  You may remove your dressing and shower at discharge - do not submerge the puncture sites for 72 hours.   Moderate Conscious Sedation, Adult, Care After These instructions provide you with information about caring for yourself after your procedure. Your health care provider may also give you more specific instructions. Your treatment has been planned according to current medical practices, but problems sometimes occur. Call your health care provider if you   Uterine Artery Embolization for Fibroids  Uterine artery embolization is a procedure to shrink uterine fibroids. Uterine fibroids are masses of tissue (tumors) that can develop in the womb (uterus). They are also called leiomyomas. This type of tumor is not cancerous (benign) and does not spread to other parts of the body outside of the pelvic area. The pelvic area is the part of the body between the hip bones. You can have one or many fibroids. Fibroids can vary in size, shape, weight, and where they grow in the uterus. Some can become quite large. In this procedure, a thin plastic tube (catheter) is used to inject a chemical that blocks off the blood supply to the fibroid, which causes the fibroid to shrink. Tell a health care provider about:  Any allergies you have.  All medicines you are taking, including vitamins, herbs, eye drops, creams, and over-the-counter medicines.  Any problems you or family members have had with anesthetic medicines.  Any blood disorders you have.  Any surgeries you have had.  Any medical conditions you have.  Whether you are pregnant or may be pregnant. What are the risks? Generally, this is a safe procedure. However, problems may occur, including:  Bleeding.  Allergic reactions to medicines or dyes.  Damage to other structures or organs.  Infection, including blood infection (septicemia).  Injury to the  uterus from decreased blood supply.  Lack of menstrual periods (amenorrhea).  Death of tissue cells (necrosis) around your bladder or vulva.  Development of a hole between organs or from an organ to the surface of your skin (fistula).  Blood clot in the legs (deep vein thrombosis) or lung (pulmonary embolus).  Nausea and vomiting. What happens before the procedure? Staying hydrated Follow instructions from your health care provider about hydration, which may include:  Up to 2 hours before the procedure - you may continue to drink clear liquids, such as water, clear fruit juice, black coffee, and plain tea. Eating and drinking restrictions Follow instructions from your health care provider about eating and drinking, which may include:  8 hours before the procedure - stop eating heavy meals or foods such as meat, fried foods, or fatty foods.  6 hours before the procedure - stop eating light meals or foods, such as toast or cereal.  6 hours before the procedure - stop drinking milk or drinks that contain milk.  2 hours before the procedure - stop drinking clear liquids. Medicines  Ask your health care provider about: ? Changing or stopping your regular medicines. This is especially important if you are taking diabetes medicines or blood thinners. ? Taking over-the-counter medicines, vitamins, herbs, and supplements. ? Taking medicines such as aspirin and ibuprofen. These medicines can thin your blood. Do not take these medicines unless your health care provider tells you to take them.  You may be given antibiotic medicine to help prevent infection.  You may be given medicine to prevent nausea and vomiting (antiemetic). General instructions  Ask your health care  provider how your surgical site will be marked or identified.  You may be asked to shower with a germ-killing soap.  Plan to have someone take you home from the hospital or clinic.  If you will be going home right  after the procedure, plan to have someone with you for 24 hours.  You will be asked to empty your bladder. What happens during the procedure?  To lower your risk of infection: ? Your health care team will wash or sanitize their hands. ? Hair may be removed from the surgical area. ? Your skin will be washed with soap.  An IV will be inserted into one of your veins.  You will be given one or more of the following: ? A medicine to help you relax (sedative). ? A medicine to numb the area (local anesthetic).  A small cut (incision) will be made in your groin.  A catheter will be inserted into the main artery of your leg. The catheter will be guided through the artery to your uterus.  A series of images will be taken while dye is injected through the catheter in your groin. X-rays are taken at the same time. This is done to provide a road map of the blood supply to your uterus and fibroids.  Tiny plastic spheres, about the size of sand grains, will be injected through the catheter. Metal coils may be used to help block the artery. The particles will lodge in tiny branches of the uterine artery that supplies blood to the fibroids.  The procedure will be repeated on the artery that supplies the other side of the uterus.  The catheter will be removed and pressure will be applied to stop the bleeding.  A dressing will be placed over the incision. The procedure may vary among health care providers and hospitals. What happens after the procedure?  Your blood pressure, heart rate, breathing rate, and blood oxygen level will be monitored until the medicines you were given have worn off.  You will be given pain medicine as needed.  You may be given medicine for nausea and vomiting as needed.  Do not drive for 24 hours if you were given a sedative. Summary  Uterine artery embolization is a procedure to shrink uterine fibroids by blocking the blood supply to the fibroid.  You may be given a  sedative and local anesthetic for the procedure.  A catheter will be inserted into the main artery of your leg. The catheter will be guided through the artery to your uterus.  After the procedure you will be given pain medicine and medicine for nausea as needed.  Do not drive for 24 hours if you were given a sedative. This information is not intended to replace advice given to you by your health care provider. Make sure you discuss any questions you have with your health care provider. Document Revised: 03/23/2017 Document Reviewed: 07/13/2016 Elsevier Patient Education  2020 [employer].   Uterine Artery Embolization for Fibroids, Care After This sheet gives you information about how to care for yourself after your procedure. Your health care provider may also give you more specific instructions. If you have problems or questions, contact your health care provider. What can I expect after the procedure? After your procedure, it is common to have:  Pelvic cramping. You will be given pain medicine.  Nausea and vomiting. You may be given medicine to help relieve nausea. Follow these instructions at home: Incision care  Follow instructions from your  health care provider about how to take care of your incision. Make sure you: ? Wash your hands with soap and water before you change your bandage (dressing). If soap and water are not available, use hand sanitizer. ? Change your dressing as told by your health care provider.  Check your incision area every day for signs of infection. Check for: ? More redness, swelling, or pain. ? More fluid or blood. ? Warmth. ? Pus or a bad smell. Medicines   Take over-the-counter and prescription medicines only as told by your health care provider.  Do not take aspirin. It can cause bleeding.  Do not drive for 24 hours if you were given a medicine to help you relax (sedative).  Do not drive or use heavy machinery while taking prescription pain  medicine. General instructions  Ask your health care provider when you can resume sexual activity.  To prevent or treat constipation while you are taking prescription pain medicine, your health care provider may recommend that you: ? Drink enough fluid to keep your urine clear or pale yellow. ? Take over-the-counter or prescription medicines. ? Eat foods that are high in fiber, such as fresh fruits and vegetables, whole grains, and beans. ? Limit foods that are high in fat and processed sugars, such as fried and sweet foods. Contact a health care provider if:  You have a fever.  You have more redness, swelling, or pain around your incision site.  You have more fluid or blood coming from your incision site.  Your incision feels warm to the touch.  You have pus or a bad smell coming from your incision.  You have a rash.  You have uncontrolled nausea or you cannot eat or drink anything without vomiting. Get help right away if:  You have trouble breathing.  You have chest pain.  You have severe abdominal pain.  You have leg pain.  You become dizzy and faint. Summary  After your procedure, it is common to have pelvic cramping. You will be given pain medicine.  Follow instructions from your health care provider about how to take care of your incision.  Check your incision area every day for signs of infection.  Take over-the-counter and prescription medicines only as told by your health care provider. This information is not intended to replace advice given to you by your health care provider. Make sure you discuss any questions you have with your health care provider. Document Revised: 03/23/2017 Document Reviewed: 07/13/2016 Elsevier Patient Education  [employer]. have any problems or questions after your procedure. What can I expect after the procedure? After your procedure, it is common:  To feel sleepy for several hours.  To feel clumsy and have poor  balance for several hours.  To have poor judgment for several hours.  To vomit if you eat too soon. Follow these instructions at home: For at least 24 hours after the procedure:   Do not: ? Participate in activities where you could fall or become injured. ? Drive. ? Use heavy machinery. ? Drink alcohol. ? Take sleeping pills or medicines that cause drowsiness. ? Make important decisions or sign legal documents. ? Take care of children on your own.  Rest. Eating and drinking  Follow the diet recommended by your health care provider.  If you vomit: ? Drink water, juice, or soup when you can drink without vomiting. ? Make sure you have little or no nausea before eating solid foods. General instructions  Have a  responsible adult stay with you until you are awake and alert.  Take over-the-counter and prescription medicines only as told by your health care provider.  If you smoke, do not smoke without supervision.  Keep all follow-up visits as told by your health care provider. This is important. Contact a health care provider if:  You keep feeling nauseous or you keep vomiting.  You feel light-headed.  You develop a rash.  You have a fever. Get help right away if:  You have trouble breathing. This information is not intended to replace advice given to you by your health care provider. Make sure you discuss any questions you have with your health care provider. Document Revised: 03/23/2017 Document Reviewed: 07/31/2015 Elsevier Patient Education  2020 [employer].

## 2019-09-25 ENCOUNTER — Encounter: Payer: Self-pay | Admitting: *Deleted

## 2019-09-25 ENCOUNTER — Ambulatory Visit
Admission: RE | Admit: 2019-09-25 | Discharge: 2019-09-25 | Disposition: A | Discharge status: Home / Self Care | Payer: BC Managed Care – PPO | Source: Ambulatory Visit | Attending: Interventional Radiology | Admitting: Interventional Radiology

## 2019-09-25 ENCOUNTER — Other Ambulatory Visit: Payer: Self-pay

## 2019-09-25 DIAGNOSIS — D259 Leiomyoma of uterus, unspecified: Secondary | ICD-10-CM

## 2019-09-25 DIAGNOSIS — Z9889 Other specified postprocedural states: Secondary | ICD-10-CM | POA: Diagnosis not present

## 2019-09-25 HISTORY — PX: IR RADIOLOGIST EVAL & MGMT: IMG5224

## 2019-09-25 NOTE — Progress Notes (Signed)
Patient ID: Jasmine Garcia, female   DOB: [birth year], 44 y.o.   MRN: [id number]       Chief Complaint:  Symptomatic uterine fibroids, 2-week status post UFE  Referring Physician(s): Dr. Garwin Brothers  History of Present Illness: Jasmine Garcia is a 44 y.o. female who is a G5, P2 A3.  She is now 2 weeks status post successful uterine fibroid embolization performed at [facility] 09/12/2019.  Since the procedure, she has not had a menstrual cycle or any significant vaginal discharge.  No interval bleeding.  She has had a somewhat difficult prolonged recovery at home with pelvic pain, discomfort, and mild constipation.  Minor intermittent low-grade fever but other illnesses.  I think she is having a mild post embolization effect from the procedure.  Her fibroids were fairly large and required a significant volume of embosphere's to achieve successful embolization.  She currently is not taking any pain medications.  She remains on Motrin daily.  No past medical history on file.  Past Surgical History:  Procedure Laterality Date  . IR ANGIOGRAM PELVIS SELECTIVE OR SUPRASELECTIVE  09/12/2019  . IR ANGIOGRAM SELECTIVE EACH ADDITIONAL VESSEL  09/12/2019  . IR ANGIOGRAM SELECTIVE EACH ADDITIONAL VESSEL  09/12/2019  . IR EMBO TUMOR ORGAN ISCHEMIA INFARCT INC GUIDE ROADMAPPING  09/12/2019  . IR RADIOLOGIST EVAL & MGMT  07/01/2019  . IR US GUIDE VASC ACCESS RIGHT  09/12/2019    Allergies: Egg [eggs or egg-derived products]  Medications: Prior to Admission medications   Medication Sig Start Date End Date Taking? Authorizing Provider  docusate sodium (COLACE) 100 MG capsule Take 1 capsule (100 mg total) by mouth daily as needed for mild constipation or moderate constipation. 09/13/19   Candiss Norse A, PA-C  ferrous sulfate 325 (65 FE) MG EC tablet Take 325 mg by mouth 3 (three) times daily with meals.    [provider]  ondansetron (ZOFRAN) 8 MG tablet Take 1 tablet (8 mg total) by mouth  every 8 (eight) hours as needed for nausea or vomiting. 09/13/19   Candiss Norse A, PA-C  traMADol (ULTRAM) 50 MG tablet Take 1 tablet (50 mg total) by mouth every 6 (six) hours as needed for moderate pain or severe pain. 09/13/19   Candiss Norse A, PA-C     No family history on file.  Social History   Socioeconomic History  . Marital status: Single    Spouse name: Not on file  . Number of children: Not on file  . Years of education: Not on file  . Highest education level: Not on file  Occupational History  . Not on file  Tobacco Use  . Smoking status: Never Smoker  . Smokeless tobacco: Never Used  Substance and Sexual Activity  . Alcohol use: Not on file  . Drug use: Not on file  . Sexual activity: Not on file  Other Topics Concern  . Not on file  Social History Narrative  . Not on file   Social Determinants of Health   Financial Resource Strain:   . Difficulty of Paying Living Expenses:   Food Insecurity:   . Worried About Charity fundraiser in the Last Year:   . Arboriculturist in the Last Year:   Transportation Needs:   . Film/video editor (Medical):   Marland Kitchen Lack of Transportation (Non-Medical):   Physical Activity:   . Days of Exercise per Week:   . Minutes of Exercise per Session:   Stress:   .  Feeling of Stress :   Social Connections:   . Frequency of Communication with Friends and Family:   . Frequency of Social Gatherings with Friends and Family:   . Attends Religious Services:   . Active Member of Clubs or Organizations:   . Attends Archivist Meetings:   Marland Kitchen Marital Status:      Review of Systems  Review of Systems: A 12 point ROS discussed and pertinent positives are indicated in the HPI above.  All other systems are negative.  Physical Exam No direct physical exam was performed, telephone health visit only because of Covid pandemic Vital Signs: There were no vitals taken for this visit.  Imaging: IR Angiogram Pelvis  Selective Or Supraselective  Result Date: 09/12/2019 INDICATION: Symptomatic uterine fibroids, abnormal menstrual bleeding, her deficiency anemia EXAM: UTERINE FIBROID EMBOLIZATION Date:  09/12/2019 09/12/2019 11:28 am Radiologist:  M. Daryll Brod, MD Guidance:  Ultrasound and fluoroscopic MEDICATIONS: Ancef 2 g. The antibiotic was administered within 1 hour of the procedure ANESTHESIA/SEDATION: Fentanyl 100 mcg IV; Versed 4.0 mg IV, 30 mg Toradol Moderate Sedation Time:  54 minutes The patient was continuously monitored during the procedure by the interventional radiology nurse under my direct supervision. CONTRAST:  120 cc omni 300 FLUOROSCOPY TIME:  Fluoroscopy Time: 10 minutes 30 seconds (1,016 mGy). COMPLICATIONS: None immediate. PROCEDURE: Informed consent was obtained from the patient following explanation of the procedure, risks, benefits and alternatives. The patient understands, agrees and consents for the procedure. All questions were addressed. A time out was performed. Maximal barrier sterile technique utilized including caps, mask, sterile gowns, sterile gloves, large sterile drape, hand hygiene, and betadine prep. Under sterile conditions and local anesthesia, bilateralcommon femoral artery access was performed with a micropuncture needles. Ultrasound was utilized for access. Images were obtained for documentation of the patent bilateral common femoral arteries. Bentson guidewires were advanced followed by 5-French sheaths A 5-French C2 catheter was utilized to select the contralateral left internal iliac artery. Selective left internal iliac angiogram was performed. The tortuous left uterine artery was identified. Selective catheterization was performed of the left uterine artery with a microcatheter and micro guide wire. A selective left uterine angiogram was performed. This demonstrated patency of the left uterine artery. Mild diffuse hypervascularity of the enlarged fibroid uterus. Access was  adequate for embolization. For embolization, 1.5 vials of 500 - 700 micron Embospheres and 1.5 vialsof 700-900 micron Embospheres were injected into the left uterine artery. Post embolization angiogram confirms complete stasis of the left uterine vascular territory. Microcatheter was removed. A C2 catheter was also utilized to select the right internal iliac artery. Selective right internal iliac angiogram was performed. The patent right uterine artery was identified. For selective catheterization, the micro catheter and guidewire were utilized to select the right uterine artery. Selective right uterine angiogram was performed. This demonstrated patency of the right uterine artery. Catheter position was safe for embolization. Embolization was performed to complete stasis with injection of 1.5 vials of 500-700 micron Embospheres and 1.5 vialsof 700-900 micron Embospheres. Post embolization angiogram confirms complete stasis of the right uterine vascular territory. C2 catheters and micro catheters were removed. Injection of both common femoral artery sheath confirms access is adequate for ExoSeal. Hemostasis was obtained with and ExoSeal device on the left but unsuccessful on the right. No immediate complication. Peripheral pedal pulses are normal +2. The patient tolerated the procedure well. No immediate complication. IMPRESSION: Successful bilateral uterine artery embolization (U F E) Electronically Signed   By: Jerilynn Mages.  Avenir Lozinski M.D.   On: 09/12/2019 11:57   IR Angiogram Selective Each Additional Vessel  Result Date: 09/12/2019 INDICATION: Symptomatic uterine fibroids, abnormal menstrual bleeding, her deficiency anemia EXAM: UTERINE FIBROID EMBOLIZATION Date:  09/12/2019 09/12/2019 11:28 am Radiologist:  M. Daryll Brod, MD Guidance:  Ultrasound and fluoroscopic MEDICATIONS: Ancef 2 g. The antibiotic was administered within 1 hour of the procedure ANESTHESIA/SEDATION: Fentanyl 100 mcg IV; Versed 4.0 mg IV, 30 mg Toradol  Moderate Sedation Time:  54 minutes The patient was continuously monitored during the procedure by the interventional radiology nurse under my direct supervision. CONTRAST:  120 cc omni 300 FLUOROSCOPY TIME:  Fluoroscopy Time: 10 minutes 30 seconds (1,016 mGy). COMPLICATIONS: None immediate. PROCEDURE: Informed consent was obtained from the patient following explanation of the procedure, risks, benefits and alternatives. The patient understands, agrees and consents for the procedure. All questions were addressed. A time out was performed. Maximal barrier sterile technique utilized including caps, mask, sterile gowns, sterile gloves, large sterile drape, hand hygiene, and betadine prep. Under sterile conditions and local anesthesia, bilateralcommon femoral artery access was performed with a micropuncture needles. Ultrasound was utilized for access. Images were obtained for documentation of the patent bilateral common femoral arteries. Bentson guidewires were advanced followed by 5-French sheaths A 5-French C2 catheter was utilized to select the contralateral left internal iliac artery. Selective left internal iliac angiogram was performed. The tortuous left uterine artery was identified. Selective catheterization was performed of the left uterine artery with a microcatheter and micro guide wire. A selective left uterine angiogram was performed. This demonstrated patency of the left uterine artery. Mild diffuse hypervascularity of the enlarged fibroid uterus. Access was adequate for embolization. For embolization, 1.5 vials of 500 - 700 micron Embospheres and 1.5 vialsof 700-900 micron Embospheres were injected into the left uterine artery. Post embolization angiogram confirms complete stasis of the left uterine vascular territory. Microcatheter was removed. A C2 catheter was also utilized to select the right internal iliac artery. Selective right internal iliac angiogram was performed. The patent right uterine artery  was identified. For selective catheterization, the micro catheter and guidewire were utilized to select the right uterine artery. Selective right uterine angiogram was performed. This demonstrated patency of the right uterine artery. Catheter position was safe for embolization. Embolization was performed to complete stasis with injection of 1.5 vials of 500-700 micron Embospheres and 1.5 vialsof 700-900 micron Embospheres. Post embolization angiogram confirms complete stasis of the right uterine vascular territory. C2 catheters and micro catheters were removed. Injection of both common femoral artery sheath confirms access is adequate for ExoSeal. Hemostasis was obtained with and ExoSeal device on the left but unsuccessful on the right. No immediate complication. Peripheral pedal pulses are normal +2. The patient tolerated the procedure well. No immediate complication. IMPRESSION: Successful bilateral uterine artery embolization (U F E) Electronically Signed   By: Jerilynn Mages.  Burnie Hank M.D.   On: 09/12/2019 11:57   IR Angiogram Selective Each Additional Vessel  Result Date: 09/12/2019 INDICATION: Symptomatic uterine fibroids, abnormal menstrual bleeding, her deficiency anemia EXAM: UTERINE FIBROID EMBOLIZATION Date:  09/12/2019 09/12/2019 11:28 am Radiologist:  M. Daryll Brod, MD Guidance:  Ultrasound and fluoroscopic MEDICATIONS: Ancef 2 g. The antibiotic was administered within 1 hour of the procedure ANESTHESIA/SEDATION: Fentanyl 100 mcg IV; Versed 4.0 mg IV, 30 mg Toradol Moderate Sedation Time:  54 minutes The patient was continuously monitored during the procedure by the interventional radiology nurse under my direct supervision. CONTRAST:  120 cc omni 300 FLUOROSCOPY TIME:  Fluoroscopy Time: 10 minutes 30 seconds (1,016 mGy). COMPLICATIONS: None immediate. PROCEDURE: Informed consent was obtained from the patient following explanation of the procedure, risks, benefits and alternatives. The patient understands, agrees  and consents for the procedure. All questions were addressed. A time out was performed. Maximal barrier sterile technique utilized including caps, mask, sterile gowns, sterile gloves, large sterile drape, hand hygiene, and betadine prep. Under sterile conditions and local anesthesia, bilateralcommon femoral artery access was performed with a micropuncture needles. Ultrasound was utilized for access. Images were obtained for documentation of the patent bilateral common femoral arteries. Bentson guidewires were advanced followed by 5-French sheaths A 5-French C2 catheter was utilized to select the contralateral left internal iliac artery. Selective left internal iliac angiogram was performed. The tortuous left uterine artery was identified. Selective catheterization was performed of the left uterine artery with a microcatheter and micro guide wire. A selective left uterine angiogram was performed. This demonstrated patency of the left uterine artery. Mild diffuse hypervascularity of the enlarged fibroid uterus. Access was adequate for embolization. For embolization, 1.5 vials of 500 - 700 micron Embospheres and 1.5 vialsof 700-900 micron Embospheres were injected into the left uterine artery. Post embolization angiogram confirms complete stasis of the left uterine vascular territory. Microcatheter was removed. A C2 catheter was also utilized to select the right internal iliac artery. Selective right internal iliac angiogram was performed. The patent right uterine artery was identified. For selective catheterization, the micro catheter and guidewire were utilized to select the right uterine artery. Selective right uterine angiogram was performed. This demonstrated patency of the right uterine artery. Catheter position was safe for embolization. Embolization was performed to complete stasis with injection of 1.5 vials of 500-700 micron Embospheres and 1.5 vialsof 700-900 micron Embospheres. Post embolization angiogram  confirms complete stasis of the right uterine vascular territory. C2 catheters and micro catheters were removed. Injection of both common femoral artery sheath confirms access is adequate for ExoSeal. Hemostasis was obtained with and ExoSeal device on the left but unsuccessful on the right. No immediate complication. Peripheral pedal pulses are normal +2. The patient tolerated the procedure well. No immediate complication. IMPRESSION: Successful bilateral uterine artery embolization (U F E) Electronically Signed   By: Jerilynn Mages.  Julieann Drummonds M.D.   On: 09/12/2019 11:57   IR US Guide Vasc Access Right  Result Date: 09/12/2019 INDICATION: Symptomatic uterine fibroids, abnormal menstrual bleeding, her deficiency anemia EXAM: UTERINE FIBROID EMBOLIZATION Date:  09/12/2019 09/12/2019 11:28 am Radiologist:  M. Daryll Brod, MD Guidance:  Ultrasound and fluoroscopic MEDICATIONS: Ancef 2 g. The antibiotic was administered within 1 hour of the procedure ANESTHESIA/SEDATION: Fentanyl 100 mcg IV; Versed 4.0 mg IV, 30 mg Toradol Moderate Sedation Time:  54 minutes The patient was continuously monitored during the procedure by the interventional radiology nurse under my direct supervision. CONTRAST:  120 cc omni 300 FLUOROSCOPY TIME:  Fluoroscopy Time: 10 minutes 30 seconds (1,016 mGy). COMPLICATIONS: None immediate. PROCEDURE: Informed consent was obtained from the patient following explanation of the procedure, risks, benefits and alternatives. The patient understands, agrees and consents for the procedure. All questions were addressed. A time out was performed. Maximal barrier sterile technique utilized including caps, mask, sterile gowns, sterile gloves, large sterile drape, hand hygiene, and betadine prep. Under sterile conditions and local anesthesia, bilateralcommon femoral artery access was performed with a micropuncture needles. Ultrasound was utilized for access. Images were obtained for documentation of the patent bilateral  common femoral arteries. Bentson guidewires were advanced followed by 5-French sheaths  A 5-French C2 catheter was utilized to select the contralateral left internal iliac artery. Selective left internal iliac angiogram was performed. The tortuous left uterine artery was identified. Selective catheterization was performed of the left uterine artery with a microcatheter and micro guide wire. A selective left uterine angiogram was performed. This demonstrated patency of the left uterine artery. Mild diffuse hypervascularity of the enlarged fibroid uterus. Access was adequate for embolization. For embolization, 1.5 vials of 500 - 700 micron Embospheres and 1.5 vialsof 700-900 micron Embospheres were injected into the left uterine artery. Post embolization angiogram confirms complete stasis of the left uterine vascular territory. Microcatheter was removed. A C2 catheter was also utilized to select the right internal iliac artery. Selective right internal iliac angiogram was performed. The patent right uterine artery was identified. For selective catheterization, the micro catheter and guidewire were utilized to select the right uterine artery. Selective right uterine angiogram was performed. This demonstrated patency of the right uterine artery. Catheter position was safe for embolization. Embolization was performed to complete stasis with injection of 1.5 vials of 500-700 micron Embospheres and 1.5 vialsof 700-900 micron Embospheres. Post embolization angiogram confirms complete stasis of the right uterine vascular territory. C2 catheters and micro catheters were removed. Injection of both common femoral artery sheath confirms access is adequate for ExoSeal. Hemostasis was obtained with and ExoSeal device on the left but unsuccessful on the right. No immediate complication. Peripheral pedal pulses are normal +2. The patient tolerated the procedure well. No immediate complication. IMPRESSION: Successful bilateral uterine  artery embolization (U F E) Electronically Signed   By: Jerilynn Mages.  Quinlyn Tep M.D.   On: 09/12/2019 11:57   IR EMBO TUMOR ORGAN ISCHEMIA INFARCT INC GUIDE ROADMAPPING  Result Date: 09/12/2019 INDICATION: Symptomatic uterine fibroids, abnormal menstrual bleeding, her deficiency anemia EXAM: UTERINE FIBROID EMBOLIZATION Date:  09/12/2019 09/12/2019 11:28 am Radiologist:  M. Daryll Brod, MD Guidance:  Ultrasound and fluoroscopic MEDICATIONS: Ancef 2 g. The antibiotic was administered within 1 hour of the procedure ANESTHESIA/SEDATION: Fentanyl 100 mcg IV; Versed 4.0 mg IV, 30 mg Toradol Moderate Sedation Time:  54 minutes The patient was continuously monitored during the procedure by the interventional radiology nurse under my direct supervision. CONTRAST:  120 cc omni 300 FLUOROSCOPY TIME:  Fluoroscopy Time: 10 minutes 30 seconds (1,016 mGy). COMPLICATIONS: None immediate. PROCEDURE: Informed consent was obtained from the patient following explanation of the procedure, risks, benefits and alternatives. The patient understands, agrees and consents for the procedure. All questions were addressed. A time out was performed. Maximal barrier sterile technique utilized including caps, mask, sterile gowns, sterile gloves, large sterile drape, hand hygiene, and betadine prep. Under sterile conditions and local anesthesia, bilateralcommon femoral artery access was performed with a micropuncture needles. Ultrasound was utilized for access. Images were obtained for documentation of the patent bilateral common femoral arteries. Bentson guidewires were advanced followed by 5-French sheaths A 5-French C2 catheter was utilized to select the contralateral left internal iliac artery. Selective left internal iliac angiogram was performed. The tortuous left uterine artery was identified. Selective catheterization was performed of the left uterine artery with a microcatheter and micro guide wire. A selective left uterine angiogram was performed.  This demonstrated patency of the left uterine artery. Mild diffuse hypervascularity of the enlarged fibroid uterus. Access was adequate for embolization. For embolization, 1.5 vials of 500 - 700 micron Embospheres and 1.5 vialsof 700-900 micron Embospheres were injected into the left uterine artery. Post embolization angiogram confirms complete stasis of the left uterine  vascular territory. Microcatheter was removed. A C2 catheter was also utilized to select the right internal iliac artery. Selective right internal iliac angiogram was performed. The patent right uterine artery was identified. For selective catheterization, the micro catheter and guidewire were utilized to select the right uterine artery. Selective right uterine angiogram was performed. This demonstrated patency of the right uterine artery. Catheter position was safe for embolization. Embolization was performed to complete stasis with injection of 1.5 vials of 500-700 micron Embospheres and 1.5 vialsof 700-900 micron Embospheres. Post embolization angiogram confirms complete stasis of the right uterine vascular territory. C2 catheters and micro catheters were removed. Injection of both common femoral artery sheath confirms access is adequate for ExoSeal. Hemostasis was obtained with and ExoSeal device on the left but unsuccessful on the right. No immediate complication. Peripheral pedal pulses are normal +2. The patient tolerated the procedure well. No immediate complication. IMPRESSION: Successful bilateral uterine artery embolization (U F E) Electronically Signed   By: Jerilynn Mages.  [place]ia Noori M.D.   On: 09/12/2019 11:57    Labs:  CBC: Recent Labs    09/12/19 0817  WBC 4.6  HGB 9.4*  HCT 32.3*  PLT 346    COAGS: Recent Labs    09/12/19 0817  INR 0.9    BMP: Recent Labs    09/12/19 0817  NA 140  K 3.9  CL 109  CO2 24  GLUCOSE 106*  BUN 10  CALCIUM 9.0  CREATININE 0.71  GFRNONAA >60  GFRAA >60    LIVER FUNCTION TESTS: No  results for input(s): BILITOT, AST, ALT, ALKPHOS, PROT, ALBUMIN in the last 8760 hours.  Assessment and Plan:  2 weeks status post successful uterine fibroid embolization for multiple uterine fibroids and a fairly enlarged uterus measuring up to 17 weeks size.  She describes a mild post embolization syndrome (pelvic pain, discomfort, fatigue, and low-grade fever) resulting in a prolonged 2-week recovery.  At this point this would be expected given the size of the fibroids and the volume of embosphere's necessary to achieve successful embolization on both sides.  Overall she is recovering but slowly.  After discussion, she would like to go back to work initially part-time with light duty.  Plan: Able to return to work next week for 4 hours/day with light duty, no lifting over 10 pounds.  After 1 week, activity and work can progress as tolerated.  Follow-up by telephone at 3 months and repeat MRI in 6 months.   Electronically Signed: Greggory Keen 09/25/2019, 11:01 AM   I spent a total of    40 Minutes in remote  clinical consultation, greater than 50% of which was counseling/coordinating care for this patient with symptomatic uterine fibroids.    Visit type: Audio only (telephone). Audio (no video) only due to patient's lack of internet/smartphone capability. Alternative for in-person consultation at [facility], [address], [place], [place]. This visit type was conducted due to national recommendations for restrictions regarding the COVID-19 Pandemic (e.g. social distancing).  This format is felt to be most appropriate for this patient at this time.  All issues noted in this document were discussed and addressed.

## 2019-09-26 DIAGNOSIS — J029 Acute pharyngitis, unspecified: Secondary | ICD-10-CM | POA: Diagnosis not present

## 2019-09-26 DIAGNOSIS — Z20822 Contact with and (suspected) exposure to covid-19: Secondary | ICD-10-CM | POA: Diagnosis not present

## 2019-10-09 ENCOUNTER — Telehealth: Payer: BC Managed Care – PPO

## 2020-01-01 DIAGNOSIS — D500 Iron deficiency anemia secondary to blood loss (chronic): Secondary | ICD-10-CM | POA: Diagnosis not present

## 2020-02-12 ENCOUNTER — Other Ambulatory Visit: Payer: Self-pay | Admitting: Interventional Radiology

## 2020-02-12 DIAGNOSIS — D250 Submucous leiomyoma of uterus: Secondary | ICD-10-CM

## 2020-03-05 ENCOUNTER — Other Ambulatory Visit: Payer: Self-pay

## 2020-03-05 ENCOUNTER — Ambulatory Visit (HOSPITAL_COMMUNITY)
Admission: RE | Admit: 2020-03-05 | Discharge: 2020-03-05 | Disposition: A | Discharge status: Home / Self Care | Payer: BLUE CROSS/BLUE SHIELD | Source: Ambulatory Visit | Attending: Interventional Radiology | Admitting: Interventional Radiology

## 2020-03-05 DIAGNOSIS — D250 Submucous leiomyoma of uterus: Secondary | ICD-10-CM | POA: Diagnosis not present

## 2020-03-05 DIAGNOSIS — N8311 Corpus luteum cyst of right ovary: Secondary | ICD-10-CM | POA: Diagnosis not present

## 2020-03-05 DIAGNOSIS — D259 Leiomyoma of uterus, unspecified: Secondary | ICD-10-CM | POA: Diagnosis not present

## 2020-03-05 MED ORDER — GADOBUTROL 1 MMOL/ML IV SOLN
8.0000 mL | Freq: Once | INTRAVENOUS | Status: AC | PRN
  Administered 2020-03-05: 8 mL via INTRAVENOUS

## 2020-03-10 ENCOUNTER — Other Ambulatory Visit: Payer: Self-pay

## 2020-03-10 ENCOUNTER — Ambulatory Visit
Admission: RE | Admit: 2020-03-10 | Discharge: 2020-03-10 | Disposition: A | Discharge status: Home / Self Care | Payer: BC Managed Care – PPO | Source: Ambulatory Visit | Attending: Interventional Radiology | Admitting: Interventional Radiology

## 2020-03-10 ENCOUNTER — Encounter: Payer: Self-pay | Admitting: *Deleted

## 2020-03-10 DIAGNOSIS — Z9889 Other specified postprocedural states: Secondary | ICD-10-CM | POA: Diagnosis not present

## 2020-03-10 DIAGNOSIS — D250 Submucous leiomyoma of uterus: Secondary | ICD-10-CM

## 2020-03-10 DIAGNOSIS — D259 Leiomyoma of uterus, unspecified: Secondary | ICD-10-CM | POA: Diagnosis not present

## 2020-03-10 HISTORY — PX: IR RADIOLOGIST EVAL & MGMT: IMG5224

## 2020-03-10 NOTE — Progress Notes (Signed)
Patient ID: Jasmine Garcia, female   DOB: [birth year], 44 y.o.   MRN: [id number]       Chief Complaint:  Uterine fibroids  Referring Physician(s): Dr. Garwin Brothers  History of Present Illness: Jasmine Garcia is a 44 y.o. female who is now 6 months status post uterine fibroid embolization.  Procedure performed 09/12/2019 at [facility].  Over the last 6 months she has recovered very well.  Her cycle has significantly improved previous lasting 5 days now only 2 to 3 days.  No heavy days of bleeding.  No intra-.  Bleeding or spotting between periods.  Passage of fresh blood clots has stopped.  Bulk related symptoms including pelvic pressure, pain, discomfort and constipation have improved.  Iron levels have improved resulting in improvement in her anemia.  Overall she is doing very well.  No recent GYN infections or abnormal discharge.  Follow-up MRI performed 03/05/2020.  Posttreatment MRI confirmed significant interval decrease in uterine volume in size as well as all of the individual fibroids.  All fibroids have decreased in size and demonstrate absence of contrast enhancement.  No past medical history on file.  Past Surgical History:  Procedure Laterality Date  . IR ANGIOGRAM PELVIS SELECTIVE OR SUPRASELECTIVE  09/12/2019  . IR ANGIOGRAM SELECTIVE EACH ADDITIONAL VESSEL  09/12/2019  . IR ANGIOGRAM SELECTIVE EACH ADDITIONAL VESSEL  09/12/2019  . IR EMBO TUMOR ORGAN ISCHEMIA INFARCT INC GUIDE ROADMAPPING  09/12/2019  . IR RADIOLOGIST EVAL & MGMT  07/01/2019  . IR RADIOLOGIST EVAL & MGMT  09/25/2019  . IR US GUIDE VASC ACCESS RIGHT  09/12/2019    Allergies: Egg [eggs or egg-derived products]  Medications: Prior to Admission medications   Medication Sig Start Date End Date Taking? Authorizing Provider  docusate sodium (COLACE) 100 MG capsule Take 1 capsule (100 mg total) by mouth daily as needed for mild constipation or moderate constipation. 09/13/19   Candiss Norse A, PA-C  ferrous  sulfate 325 (65 FE) MG EC tablet Take 325 mg by mouth 3 (three) times daily with meals.    [provider]  ondansetron (ZOFRAN) 8 MG tablet Take 1 tablet (8 mg total) by mouth every 8 (eight) hours as needed for nausea or vomiting. 09/13/19   Candiss Norse A, PA-C  traMADol (ULTRAM) 50 MG tablet Take 1 tablet (50 mg total) by mouth every 6 (six) hours as needed for moderate pain or severe pain. 09/13/19   Candiss Norse A, PA-C     No family history on file.  Social History   Socioeconomic History  . Marital status: Single    Spouse name: Not on file  . Number of children: Not on file  . Years of education: Not on file  . Highest education level: Not on file  Occupational History  . Not on file  Tobacco Use  . Smoking status: Never Smoker  . Smokeless tobacco: Never Used  Substance and Sexual Activity  . Alcohol use: Not on file  . Drug use: Not on file  . Sexual activity: Not on file  Other Topics Concern  . Not on file  Social History Narrative  . Not on file   Social Determinants of Health   Financial Resource Strain:   . Difficulty of Paying Living Expenses: Not on file  Food Insecurity:   . Worried About Charity fundraiser in the Last Year: Not on file  . Ran Out of Food in the Last Year: Not on file  Transportation Needs:   .  Lack of Transportation (Medical): Not on file  . Lack of Transportation (Non-Medical): Not on file  Physical Activity:   . Days of Exercise per Week: Not on file  . Minutes of Exercise per Session: Not on file  Stress:   . Feeling of Stress : Not on file  Social Connections:   . Frequency of Communication with Friends and Family: Not on file  . Frequency of Social Gatherings with Friends and Family: Not on file  . Attends Religious Services: Not on file  . Active Member of Clubs or Organizations: Not on file  . Attends Archivist Meetings: Not on file  . Marital Status: Not on file     Review of  Systems  Review of Systems: A 12 point ROS discussed and pertinent positives are indicated in the HPI above.  All other systems are negative.  Physical Exam No direct physical exam was performed, telephone health visit only today because of Covid pandemic Vital Signs: There were no vitals taken for this visit.  Imaging: MR PELVIS W WO CONTRAST  Result Date: 03/05/2020 CLINICAL DATA:  Follow-up uterine fibroids. 6 months status post uterine artery embolization. EXAM: MRI PELVIS WITHOUT AND WITH CONTRAST TECHNIQUE: Multiplanar multisequence MR imaging of the pelvis was performed both before and after administration of intravenous contrast. CONTRAST:  69mL GADAVIST GADOBUTROL 1 MMOL/ML IV SOLN COMPARISON:  07/24/2019 FINDINGS: Lower Urinary Tract: No bladder or urethral abnormality identified. Bowel:  Unremarkable visualized pelvic bowel loops. Vascular/Lymphatic: No pathologically enlarged lymph nodes or other significant abnormality. Reproductive: -- Uterus: Measures 14.5 x 8.9 x 9.3 cm (volume = 630 cm^3). This volume represents a significant decrease from 1,270 cc on prior exam. Fibroids are again seen involving the uterus diffusely, and these are either stable or decreased in size since previous study. Index fibroid in the posterior lower uterine segment currently measures 7.5 x 5.8 cm on image 18/5, compared to 10.8 x 8.0 cm previously. All of these fibroids now show lack of contrast enhancement on subtraction imaging. No new or enlarging myometrial masses are identified. -- Right ovary: Small corpus luteum cyst now seen. No mass identified. -- Left ovary:  Appears normal.  No mass identified. Other: No abnormal free fluid. Musculoskeletal:  Unremarkable. IMPRESSION: Significant interval decrease in overall uterine volume and size of individual fibroids, as described above. All of these fibroids now show absence of contrast enhancement on subtraction imaging. No new or enlarging uterine masses.  Electronically Signed   By: Marlaine Hind M.D.   On: 03/05/2020 10:49    Labs:  CBC: Recent Labs    09/12/19 0817  WBC 4.6  HGB 9.4*  HCT 32.3*  PLT 346    COAGS: Recent Labs    09/12/19 0817  INR 0.9    BMP: Recent Labs    09/12/19 0817  NA 140  K 3.9  CL 109  CO2 24  GLUCOSE 106*  BUN 10  CALCIUM 9.0  CREATININE 0.71  GFRNONAA >60  GFRAA >60    LIVER FUNCTION TESTS: No results for input(s): BILITOT, AST, ALT, ALKPHOS, PROT, ALBUMIN in the last 8760 hours.   Assessment and Plan:  31-month status post uterine fibroid embolization.  She is now asymptomatic.  Menstrual cycles have dramatically improved.  No residual bulk related symptoms.  Iron deficiency anemia is also improving.  MRI results are favorable as well.  Imaging findings are concordant with her clinical outcome.  Plan: No further follow-up with interventional radiology at this point.  Continue routine GYN care with Dr. Garwin Brothers.  Thank you for this interesting consult.  I greatly enjoyed meeting Jasmine Garcia and look forward to participating in their care.  A copy of this report was sent to the requesting provider on this date.  Electronically Signed: Greggory Keen 03/10/2020, 8:30 AM   I spent a total of    25 Minutes in remote  clinical consultation, greater than 50% of which was counseling/coordinating care for this patient with uterine fibroid status post embolization.    Visit type: Audio only (telephone). Audio (no video) only due to patient's lack of internet/smartphone capability. Alternative for in-person consultation at [facility], [address], [place], [place]. This visit type was conducted due to national recommendations for restrictions regarding the COVID-19 Pandemic (e.g. social distancing).  This format is felt to be most appropriate for this patient at this time.  All issues noted in this document were discussed and addressed.

## 2020-03-11 ENCOUNTER — Telehealth: Payer: BC Managed Care – PPO

## 2020-04-06 DIAGNOSIS — Z01419 Encounter for gynecological examination (general) (routine) without abnormal findings: Secondary | ICD-10-CM | POA: Diagnosis not present

## 2020-04-06 DIAGNOSIS — D259 Leiomyoma of uterus, unspecified: Secondary | ICD-10-CM | POA: Diagnosis not present

## 2020-05-12 DIAGNOSIS — Z1231 Encounter for screening mammogram for malignant neoplasm of breast: Secondary | ICD-10-CM | POA: Diagnosis not present

## 2020-10-05 DIAGNOSIS — D259 Leiomyoma of uterus, unspecified: Secondary | ICD-10-CM | POA: Diagnosis not present

## 2020-10-05 DIAGNOSIS — R8290 Unspecified abnormal findings in urine: Secondary | ICD-10-CM | POA: Diagnosis not present

## 2020-10-05 DIAGNOSIS — N898 Other specified noninflammatory disorders of vagina: Secondary | ICD-10-CM | POA: Diagnosis not present

## 2020-11-08 DIAGNOSIS — M67432 Ganglion, left wrist: Secondary | ICD-10-CM | POA: Diagnosis not present

## 2021-03-05 DIAGNOSIS — R3989 Other symptoms and signs involving the genitourinary system: Secondary | ICD-10-CM | POA: Diagnosis not present

## 2021-03-05 DIAGNOSIS — R350 Frequency of micturition: Secondary | ICD-10-CM | POA: Diagnosis not present

## 2021-03-05 DIAGNOSIS — R8290 Unspecified abnormal findings in urine: Secondary | ICD-10-CM | POA: Diagnosis not present

## 2021-05-12 DIAGNOSIS — D509 Iron deficiency anemia, unspecified: Secondary | ICD-10-CM | POA: Diagnosis not present

## 2021-05-12 DIAGNOSIS — R030 Elevated blood-pressure reading, without diagnosis of hypertension: Secondary | ICD-10-CM | POA: Diagnosis not present

## 2021-05-12 DIAGNOSIS — E559 Vitamin D deficiency, unspecified: Secondary | ICD-10-CM | POA: Diagnosis not present

## 2021-05-12 DIAGNOSIS — E663 Overweight: Secondary | ICD-10-CM | POA: Diagnosis not present

## 2021-05-16 DIAGNOSIS — Z0000 Encounter for general adult medical examination without abnormal findings: Secondary | ICD-10-CM | POA: Diagnosis not present

## 2021-05-16 DIAGNOSIS — Z23 Encounter for immunization: Secondary | ICD-10-CM | POA: Diagnosis not present

## 2021-05-18 DIAGNOSIS — Z01419 Encounter for gynecological examination (general) (routine) without abnormal findings: Secondary | ICD-10-CM | POA: Diagnosis not present

## 2021-05-18 DIAGNOSIS — N898 Other specified noninflammatory disorders of vagina: Secondary | ICD-10-CM | POA: Diagnosis not present

## 2021-05-18 DIAGNOSIS — Z1231 Encounter for screening mammogram for malignant neoplasm of breast: Secondary | ICD-10-CM | POA: Diagnosis not present

## 2021-05-18 DIAGNOSIS — D259 Leiomyoma of uterus, unspecified: Secondary | ICD-10-CM | POA: Diagnosis not present

## 2021-05-29 IMAGING — XA IR EMBO TUMOR ORGAN ISCHEMIA INFARCT INC GUIDE ROADMAPPING
5 series · 13 of 17 positions shown · IV contrast (IODINE)
Comparison: none

INDICATION: Symptomatic uterine fibroids, abnormal menstrual bleeding, her
deficiency anemia

[Series 3: care body 4 · 3 of 23 frames shown (1 of 5)]
[frame 4/23]
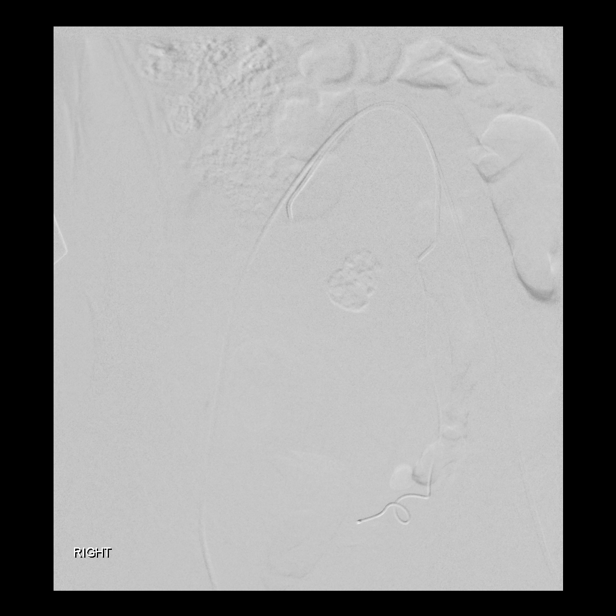
[frame 12/23]
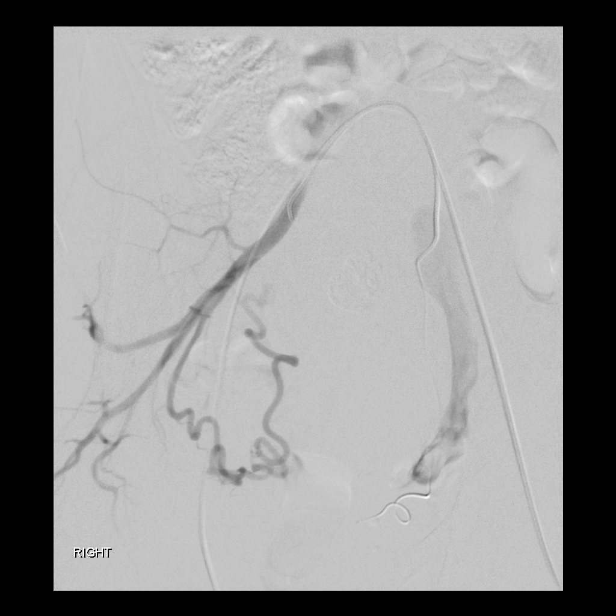
[frame 20/23]
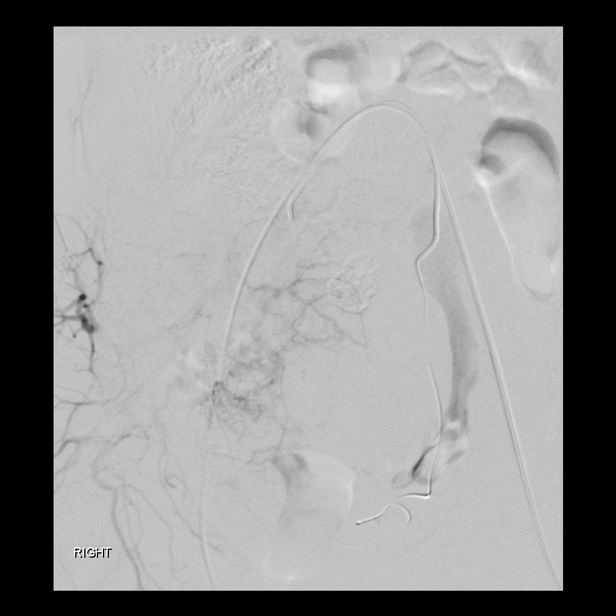

[Series 5: care body 4 · 1 of 1 slices shown (2 of 5)]
[im 1/1]
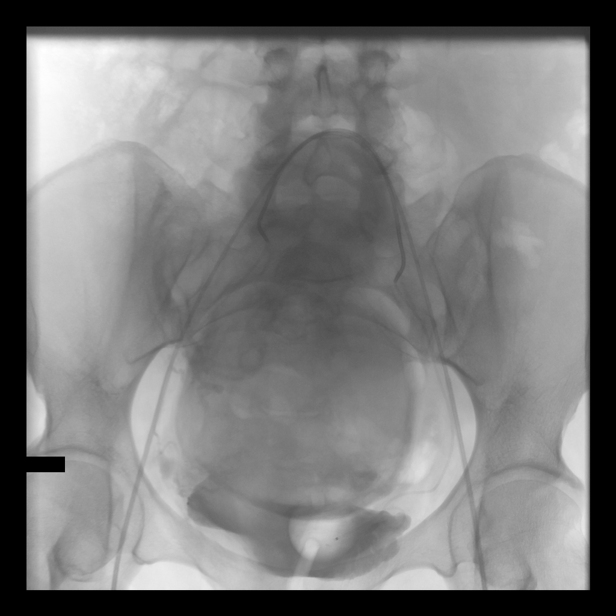

[Series 6: care body 4 · 3 of 48 frames shown (3 of 5)]
[frame 8/48]
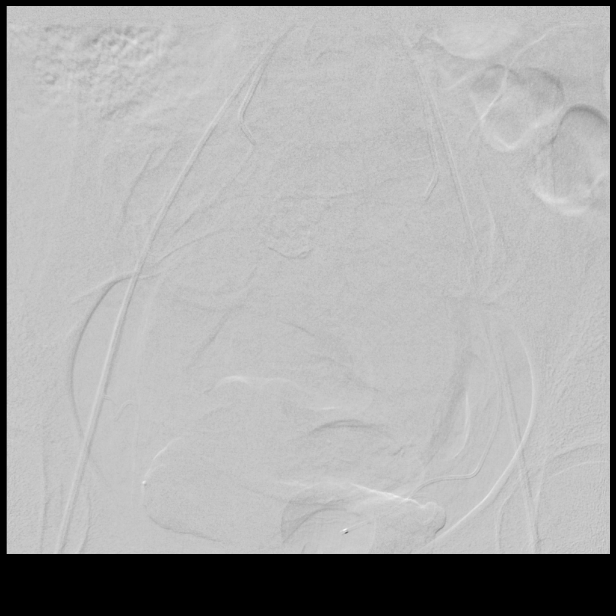
[frame 25/48]
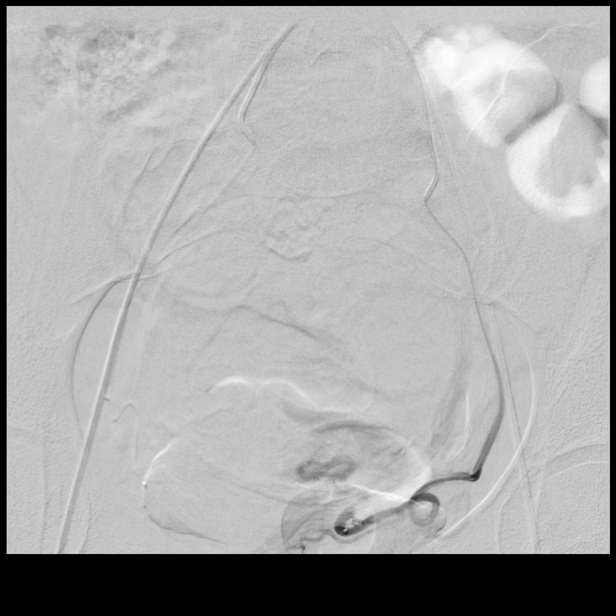
[frame 41/48]
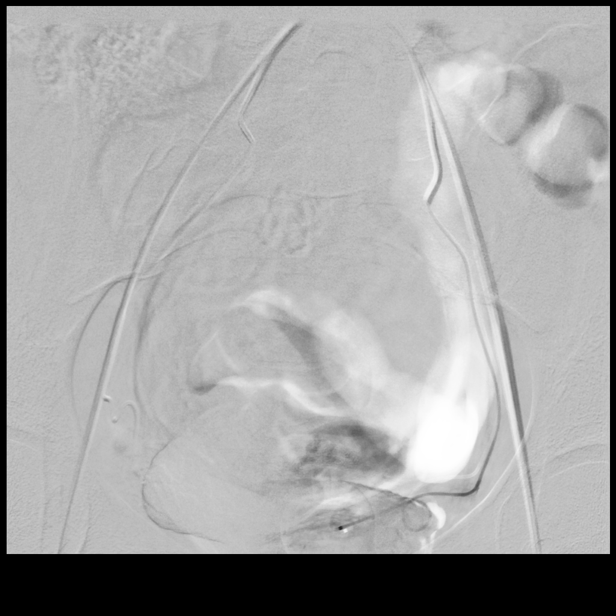

[Series 9: care body 4 · 3 of 36 frames shown (4 of 5)]
[frame 6/36]
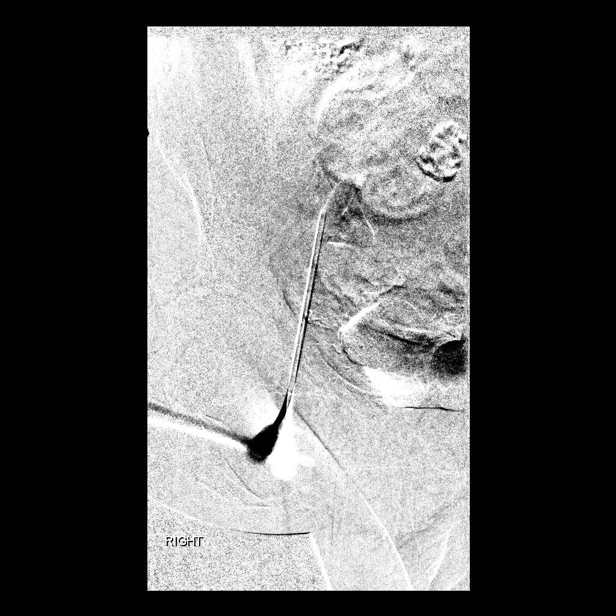
[frame 26/36]
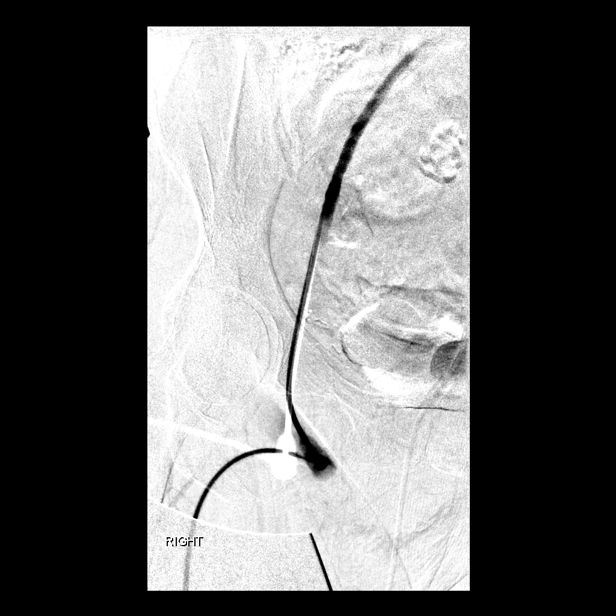
[frame 31/36]
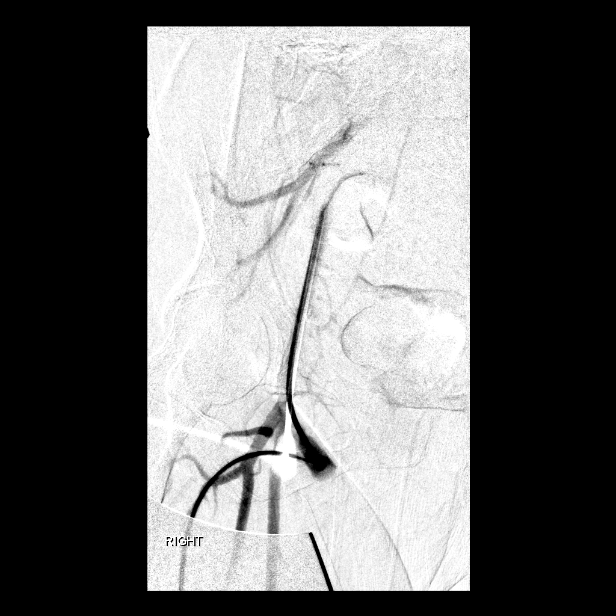

[Series 10: care body 4 · 3 of 16 frames shown (5 of 5)]
[frame 3/16]
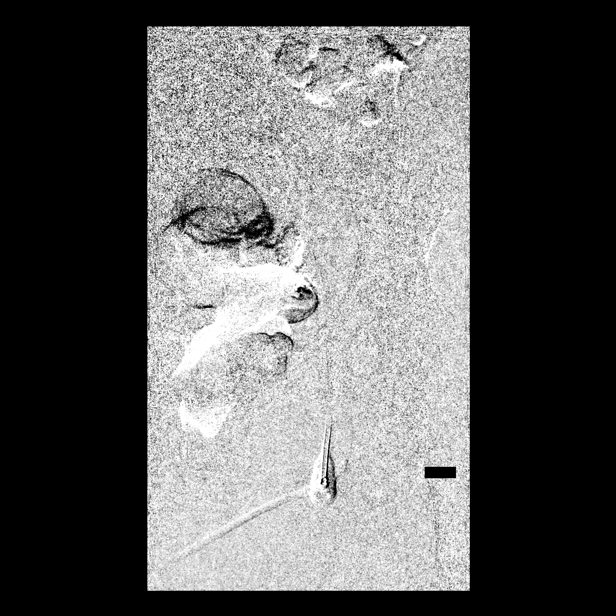
[frame 12/16]
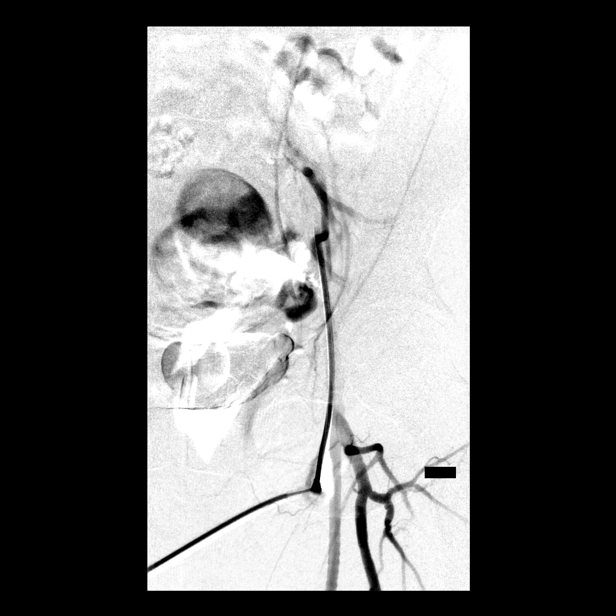
[frame 14/16]
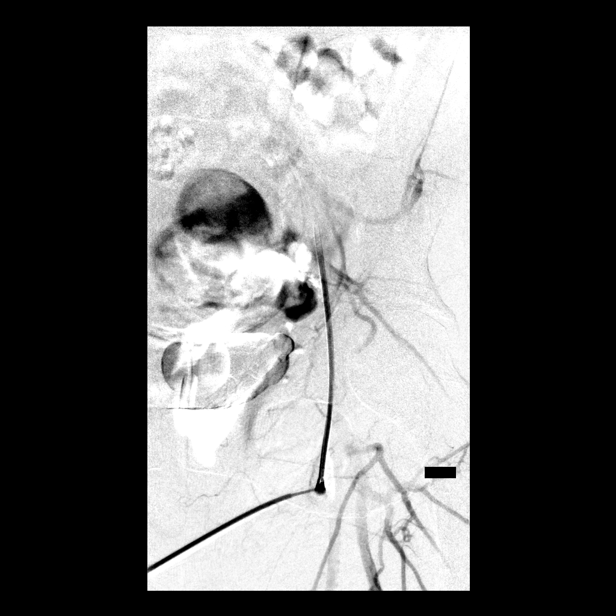

[13 of 17 positions shown; findings below may reference images not displayed]

EXAM:
UTERINE FIBROID EMBOLIZATION

Radiologist:  Millar, Kenrick

Guidance:  Ultrasound and fluoroscopic

MEDICATIONS:
Ancef 2 g. The antibiotic was administered within 1 hour of the
procedure

ANESTHESIA/SEDATION:
Fentanyl 100 mcg IV; Versed 4.0 mg IV, 30 mg Toradol

Moderate Sedation Time:  54 minutes

The patient was continuously monitored during the procedure by the
interventional radiology nurse under my direct supervision.

CONTRAST:  120 cc omni 300

FLUOROSCOPY TIME:  Fluoroscopy Time: 10 minutes 30 seconds (1,016
mGy).

COMPLICATIONS:
None immediate.

PROCEDURE:
Informed consent was obtained from the patient following explanation
of the procedure, risks, benefits and alternatives. The patient
understands, agrees and consents for the procedure. All questions
were addressed. A time out was performed.

Maximal barrier sterile technique utilized including caps, mask,
sterile gowns, sterile gloves, large sterile drape, hand hygiene,
and betadine prep.

Under sterile conditions and local anesthesia, bilateralcommon
femoral artery access was performed with a micropuncture needles.
Ultrasound was utilized for access. Images were obtained for
documentation of the patent bilateral common femoral arteries.
Busco guidewires were advanced followed by 5-French sheaths

A 5-French C2 catheter was utilized to select the contralateral left
internal iliac artery. Selective left internal iliac angiogram was
performed. The tortuous left uterine artery was identified.
Selective catheterization was performed of the left uterine artery
with a microcatheter and micro guide wire. A selective left uterine
angiogram was performed. This demonstrated patency of the left
uterine artery. Mild diffuse hypervascularity of the enlarged
fibroid uterus. Access was adequate for embolization. For
embolization, 1.5 vials of 500 - 700 micron Embospheres and
vialsof 700-900 micron Embospheres were injected into the left
uterine artery. Post embolization angiogram confirms complete stasis
of the left uterine vascular territory. Microcatheter was removed.

A C2 catheter was also utilized to select the right internal iliac
artery. Selective right internal iliac angiogram was performed. The
patent right uterine artery was identified. For selective
catheterization, the micro catheter and guidewire were utilized to
select the right uterine artery. Selective right uterine angiogram
was performed. This demonstrated patency of the right uterine
artery. Catheter position was safe for embolization. Embolization
was performed to complete stasis with injection of 1.5 vials of
500-700 micron Embospheres and 1.5 vialsof 700-900 micron
Embospheres.

Post embolization angiogram confirms complete stasis of the right
uterine vascular territory.

C2 catheters and micro catheters were removed.

Injection of both common femoral artery sheath confirms access is
adequate for ExoSeal. Hemostasis was obtained with and ExoSeal
device on the left but unsuccessful on the right. No immediate
complication. Peripheral pedal pulses are normal +2.

The patient tolerated the procedure well. No immediate complication.
IMPRESSION: Successful bilateral uterine artery embolization (U F E)

## 2021-07-05 DIAGNOSIS — Z1211 Encounter for screening for malignant neoplasm of colon: Secondary | ICD-10-CM | POA: Diagnosis not present

## 2021-07-05 DIAGNOSIS — D509 Iron deficiency anemia, unspecified: Secondary | ICD-10-CM | POA: Diagnosis not present

## 2022-04-29 ENCOUNTER — Encounter (HOSPITAL_COMMUNITY): Payer: Self-pay

## 2022-04-29 ENCOUNTER — Emergency Department (HOSPITAL_COMMUNITY)
Admission: EM | Admit: 2022-04-29 | Discharge: 2022-04-29 | Disposition: A | Discharge status: Home / Self Care | Payer: Managed Care, Other (non HMO) | Attending: Emergency Medicine | Admitting: Emergency Medicine

## 2022-04-29 ENCOUNTER — Emergency Department (HOSPITAL_COMMUNITY): Payer: Managed Care, Other (non HMO)

## 2022-04-29 ENCOUNTER — Other Ambulatory Visit: Payer: Self-pay

## 2022-04-29 DIAGNOSIS — I10 Essential (primary) hypertension: Secondary | ICD-10-CM

## 2022-04-29 DIAGNOSIS — R030 Elevated blood-pressure reading, without diagnosis of hypertension: Secondary | ICD-10-CM | POA: Insufficient documentation

## 2022-04-29 LAB — URINALYSIS, ROUTINE W REFLEX MICROSCOPIC
Bilirubin Urine: NEGATIVE
Glucose, UA: NEGATIVE mg/dL
Hgb urine dipstick: NEGATIVE
Ketones, ur: 5 mg/dL — AB
Leukocytes,Ua: NEGATIVE
Nitrite: NEGATIVE
Protein, ur: NEGATIVE mg/dL
Specific Gravity, Urine: 1.011 (ref 1.005–1.030)
pH: 7 (ref 5.0–8.0)

## 2022-04-29 LAB — CBC
HCT: 41.5 % (ref 36.0–46.0)
Hemoglobin: 14 g/dL (ref 12.0–15.0)
MCH: 31.5 pg (ref 26.0–34.0)
MCHC: 33.7 g/dL (ref 30.0–36.0)
MCV: 93.5 fL (ref 80.0–100.0)
Platelets: 242 10*3/uL (ref 150–400)
RBC: 4.44 MIL/uL (ref 3.87–5.11)
RDW: 12.4 % (ref 11.5–15.5)
WBC: 5.1 10*3/uL (ref 4.0–10.5)
nRBC: 0 % (ref 0.0–0.2)

## 2022-04-29 LAB — COMPREHENSIVE METABOLIC PANEL
ALT: 14 U/L (ref 0–44)
AST: 20 U/L (ref 15–41)
Albumin: 3.7 g/dL (ref 3.5–5.0)
Alkaline Phosphatase: 51 U/L (ref 38–126)
Anion gap: 10 (ref 5–15)
BUN: 8 mg/dL (ref 6–20)
CO2: 27 mmol/L (ref 22–32)
Calcium: 9.1 mg/dL (ref 8.9–10.3)
Chloride: 102 mmol/L (ref 98–111)
Creatinine, Ser: 0.82 mg/dL (ref 0.44–1.00)
GFR, Estimated: >60 mL/min (ref >60)
Glucose, Bld: 87 mg/dL (ref 70–99)
Potassium: 3.7 mmol/L (ref 3.5–5.1)
Sodium: 139 mmol/L (ref 135–145)
Total Bilirubin: 0.7 mg/dL (ref 0.3–1.2)
Total Protein: 7.4 g/dL (ref 6.5–8.1)

## 2022-04-29 LAB — PREGNANCY, URINE: Preg Test, Ur: NEGATIVE

## 2022-04-29 LAB — TSH: TSH: 1.165 u[IU]/mL (ref 0.350–4.500)

## 2022-04-29 MED ORDER — AMLODIPINE BESYLATE 5 MG PO TABS: 5.0000 mg | ORAL_TABLET | Freq: Every day | ORAL | 0 refills | Status: AC

## 2022-04-29 MED ORDER — AMLODIPINE BESYLATE 5 MG PO TABS
5.0000 mg | ORAL_TABLET | Freq: Once | ORAL | Status: AC
  Administered 2022-04-29: 5 mg via ORAL
  Filled 2022-04-29: qty 1

## 2022-04-29 NOTE — ED Triage Notes (Signed)
Pt arrived via POV, c/o HTN, head pain, left facial tinging. Grip strengths equal.

## 2022-04-29 NOTE — ED Provider Notes (Signed)
[facility] DEPT Provider Note   CSN: [id number] Arrival date & time: 04/29/22  1455     History  Chief Complaint  Patient presents with   Hypertension    Jasmine Garcia is a 47 y.o. female.  Patient with c/o high blood pressure. Indicates symptoms began yesterday with tingling/burning sensation to left scalp and face. Symptoms acute onset, mild, persistent. Checked blood pressure and it has been high, in range 170/110. Denies prior hx htn or tx for same. No fam hx htn. Denies any acute or severe headaches. No change in speech or vision. No numbness or weakness. No problems w balance, walking, coordination or normal function. Denies chest pain or discomfort. No sob. No leg pain or swelling.   The history is provided by the patient and medical records.  Hypertension Pertinent negatives include no chest pain, no abdominal pain, no headaches and no shortness of breath.       Home Medications Prior to Admission medications   Medication Sig Start Date End Date Taking? Authorizing Provider  docusate sodium (COLACE) 100 MG capsule Take 1 capsule (100 mg total) by mouth daily as needed for mild constipation or moderate constipation. 09/13/19   Candiss Norse A, PA-C  ferrous sulfate 325 (65 FE) MG EC tablet Take 325 mg by mouth 3 (three) times daily with meals.    [provider]  ondansetron (ZOFRAN) 8 MG tablet Take 1 tablet (8 mg total) by mouth every 8 (eight) hours as needed for nausea or vomiting. 09/13/19   Candiss Norse A, PA-C  traMADol (ULTRAM) 50 MG tablet Take 1 tablet (50 mg total) by mouth every 6 (six) hours as needed for moderate pain or severe pain. 09/13/19   Candiss Norse A, PA-C      Allergies    Eggs or egg-derived products    Review of Systems   Review of Systems  Constitutional:  Negative for fever.  HENT:  Negative for sinus pain and sore throat.   Eyes:  Negative for redness and visual disturbance.   Respiratory:  Negative for cough and shortness of breath.   Cardiovascular:  Negative for chest pain.  Gastrointestinal:  Negative for abdominal pain, nausea and vomiting.  Genitourinary:  Negative for flank pain.  Musculoskeletal:  Negative for back pain and neck pain.  Skin:  Negative for rash.  Neurological:  Positive for light-headedness. Negative for speech difficulty and headaches.  Hematological:  Does not bruise/bleed easily.  Psychiatric/Behavioral:  Negative for confusion.     Physical Exam Updated Vital Signs BP (!) 153/101   Pulse 88   Temp 98.4 F (36.9 C) (Oral)   Resp (!) 26   SpO2 99%  Physical Exam Vitals and nursing note reviewed.  Constitutional:      Appearance: Normal appearance. She is well-developed.  HENT:     Head: Atraumatic.     Comments: No skin lesions, rash or erythema to scalp/face. Eac/tm normal.     Nose: Nose normal.     Mouth/Throat:     Mouth: Mucous membranes are moist.  Eyes:     General: No scleral icterus.    Conjunctiva/sclera: Conjunctivae normal.     Pupils: Pupils are equal, round, and reactive to light.  Neck:     Trachea: No tracheal deviation.     Comments: Thyroid not grossly enlarged or tender. Trachea midline. No bruits.  Cardiovascular:     Rate and Rhythm: Normal rate and regular rhythm.     Pulses: Normal  pulses.     Heart sounds: Normal heart sounds. No murmur heard.    No friction rub. No gallop.  Pulmonary:     Effort: Pulmonary effort is normal. No respiratory distress.     Breath sounds: Normal breath sounds.  Abdominal:     General: Bowel sounds are normal. There is no distension.     Palpations: Abdomen is soft. There is no mass.     Tenderness: There is no abdominal tenderness. There is no guarding.     Comments: No bruits.   Genitourinary:    Comments: Marland Kitchen  Musculoskeletal:        General: No swelling or tenderness.     Cervical back: Normal range of motion and neck supple. No rigidity. No muscular  tenderness.     Right lower leg: No edema.     Left lower leg: No edema.  Skin:    General: Skin is warm and dry.     Findings: No rash.  Neurological:     Mental Status: She is alert.     Comments: Alert, speech normal. Motor/sens grossly intact bil. Stre 5/5. Steady gait.   Psychiatric:        Mood and Affect: Mood normal.     ED Results / Procedures / Treatments   Labs (all labs ordered are listed, but only abnormal results are displayed) Results for orders placed or performed during the hospital encounter of 04/29/22  Comprehensive metabolic panel  Result Value Ref Range   Sodium 139 135 - 145 mmol/L   Potassium 3.7 3.5 - 5.1 mmol/L   Chloride 102 98 - 111 mmol/L   CO2 27 22 - 32 mmol/L   Glucose, Bld 87 70 - 99 mg/dL   BUN 8 6 - 20 mg/dL   Creatinine, Ser 0.82 0.44 - 1.00 mg/dL   Calcium 9.1 8.9 - 10.3 mg/dL   Total Protein 7.4 6.5 - 8.1 g/dL   Albumin 3.7 3.5 - 5.0 g/dL   AST 20 15 - 41 U/L   ALT 14 0 - 44 U/L   Alkaline Phosphatase 51 38 - 126 U/L   Total Bilirubin 0.7 0.3 - 1.2 mg/dL   GFR, Estimated >60 >60 mL/min   Anion gap 10 5 - 15  CBC  Result Value Ref Range   WBC 5.1 4.0 - 10.5 K/uL   RBC 4.44 3.87 - 5.11 MIL/uL   Hemoglobin 14.0 12.0 - 15.0 g/dL   HCT 41.5 36.0 - 46.0 %   MCV 93.5 80.0 - 100.0 fL   MCH 31.5 26.0 - 34.0 pg   MCHC 33.7 30.0 - 36.0 g/dL   RDW 12.4 11.5 - 15.5 %   Platelets 242 150 - 400 K/uL   nRBC 0.0 0.0 - 0.2 %  Urinalysis, Routine w reflex microscopic Urine, Clean Catch  Result Value Ref Range   Color, Urine YELLOW YELLOW   APPearance HAZY (A) CLEAR   Specific Gravity, Urine 1.011 1.005 - 1.030   pH 7.0 5.0 - 8.0   Glucose, UA NEGATIVE NEGATIVE mg/dL   Hgb urine dipstick NEGATIVE NEGATIVE   Bilirubin Urine NEGATIVE NEGATIVE   Ketones, ur 5 (A) NEGATIVE mg/dL   Protein, ur NEGATIVE NEGATIVE mg/dL   Nitrite NEGATIVE NEGATIVE   Leukocytes,Ua NEGATIVE NEGATIVE  Pregnancy, urine  Result Value Ref Range   Preg Test, Ur  NEGATIVE NEGATIVE     EKG None  Radiology CT Head Wo Contrast  Result Date: 04/29/2022 CLINICAL DATA:  Numbness and tingling.  Paresthesias. EXAM: CT HEAD WITHOUT CONTRAST TECHNIQUE: Contiguous axial images were obtained from the base of the skull through the vertex without intravenous contrast. RADIATION DOSE REDUCTION: This exam was performed according to the departmental dose-optimization program which includes automated exposure control, adjustment of the mA and/or kV according to patient size and/or use of iterative reconstruction technique. COMPARISON:  None Available. FINDINGS: Brain: No evidence of acute infarction, hemorrhage, hydrocephalus, extra-axial collection or mass lesion/mass effect. Vascular: No hyperdense vessel or unexpected calcification. Skull: Normal. Negative for fracture or focal lesion. Sinuses/Orbits: No acute finding. Other: None. IMPRESSION: No acute intracranial abnormality. Electronically Signed   By: Dorise Bullion III M.D.   On: 04/29/2022 16:12    Procedures Procedures    Medications Ordered in ED Medications  amLODipine (NORVASC) tablet 5 mg (has no administration in time range)    ED Course/ Medical Decision Making/ A&P                           Medical Decision Making Problems Addressed: Elevated blood pressure reading: acute illness or injury with systemic symptoms that poses a threat to life or bodily functions Uncontrolled hypertension: acute illness or injury with systemic symptoms that poses a threat to life or bodily functions  Amount and/or Complexity of Data Reviewed External Data Reviewed: notes. Labs: ordered. Decision-making details documented in ED Course. Radiology: ordered and independent interpretation performed. Decision-making details documented in ED Course.  Risk Prescription drug management. Decision regarding hospitalization.   Iv ns. Continuous pulse ox and cardiac monitoring. Labs ordered/sent. Imaging ordered.    Differential diagnosis includes  essential hypertension, aki, uncontrolled htn, etc. Dispo decision including potential need for admission considered - will get labs and imaging and reassess.   Reviewed nursing notes and prior charts for additional history. External reports reviewed.   Cardiac monitor: sinus rhythm, rate 88.  Labs reviewed/interpreted by me - chem/cr normal.   CT reviewed/interpreted by me - no hem.   Recheck pt remains high. On prior visits in epic, noted with borderline htn then. Will tx. Amlodipine po.   Rec close pcp f/u.  Return precautions provided.          Final Clinical Impression(s) / ED Diagnoses Final diagnoses:  Elevated blood pressure reading  Uncontrolled hypertension    Rx / DC Orders ED Discharge Orders     None         Lajean Saver, MD 04/29/22 [phone]

## 2022-04-29 NOTE — Discharge Instructions (Addendum)
It was our pleasure to provide your ER care today - we hope that you feel better.  For high blood pressure, take medication as prescribed, limit salt intake, follow heart healthy eating plan, get adequate exercise and pursue other stress relief therapies (yoga, meditation, etc.), and follow up closely with primary care doctor in the next 2-3 weeks.   Return to ER if worse, new symptoms, severe headache, change in speech or vision, numbness/weakness, chest pain, trouble breathing, or other emergency concern.
